# Patient Record
Sex: Male | Born: 1935 | Race: White | Hispanic: No | Marital: Married | Smoking: Former smoker
Health system: Southern US, Community
[De-identification: ages and names within clinical notes are randomized; demographics above are authoritative.]

## PROBLEM LIST (undated history)

## (undated) DIAGNOSIS — I4891 Unspecified atrial fibrillation: Secondary | ICD-10-CM

## (undated) DIAGNOSIS — K5730 Diverticulosis of large intestine without perforation or abscess without bleeding: Secondary | ICD-10-CM

## (undated) DIAGNOSIS — M1990 Unspecified osteoarthritis, unspecified site: Secondary | ICD-10-CM

## (undated) DIAGNOSIS — J302 Other seasonal allergic rhinitis: Secondary | ICD-10-CM

## (undated) DIAGNOSIS — Z923 Personal history of irradiation: Secondary | ICD-10-CM

## (undated) DIAGNOSIS — L719 Rosacea, unspecified: Secondary | ICD-10-CM

## (undated) DIAGNOSIS — J449 Chronic obstructive pulmonary disease, unspecified: Secondary | ICD-10-CM

## (undated) DIAGNOSIS — C4491 Basal cell carcinoma of skin, unspecified: Secondary | ICD-10-CM

## (undated) HISTORY — PX: EAR BIOPSY: SHX1480

## (undated) HISTORY — DX: Unspecified osteoarthritis, unspecified site: M19.90

## (undated) HISTORY — DX: Other seasonal allergic rhinitis: J30.2

## (undated) HISTORY — DX: Diverticulosis of large intestine without perforation or abscess without bleeding: K57.30

## (undated) HISTORY — DX: Unspecified atrial fibrillation: I48.91

## (undated) HISTORY — DX: Basal cell carcinoma of skin, unspecified: C44.91

## (undated) HISTORY — DX: Rosacea, unspecified: L71.9

## (undated) HISTORY — DX: Chronic obstructive pulmonary disease, unspecified: J44.9

---

## 2004-04-05 ENCOUNTER — Encounter: Admission: RE | Admit: 2004-04-05 | Discharge: 2004-04-05 | Payer: Self-pay | Admitting: Internal Medicine

## 2013-08-03 ENCOUNTER — Telehealth: Payer: Self-pay | Admitting: *Deleted

## 2013-08-03 NOTE — Telephone Encounter (Signed)
Patient is out of pradaxa and would like [facility] ref[facility] [address]. Thanks, MI

## 2013-08-04 ENCOUNTER — Telehealth: Payer: Self-pay

## 2013-08-04 ENCOUNTER — Other Ambulatory Visit: Payer: Self-pay | Admitting: Cardiology

## 2013-08-04 MED ORDER — DABIGATRAN ETEXILATE MESYLATE 150 MG PO CAPS: 150.0000 mg | ORAL_CAPSULE | Freq: Two times a day (BID) | ORAL | Status: DC

## 2013-08-11 NOTE — Telephone Encounter (Signed)
ERROR

## 2013-08-17 ENCOUNTER — Telehealth: Payer: Self-pay | Admitting: *Deleted

## 2013-08-17 NOTE — Telephone Encounter (Signed)
[employer] rx approved pradaxa through 08/12/2014 pa # [id number]

## 2013-12-09 ENCOUNTER — Ambulatory Visit (INDEPENDENT_AMBULATORY_CARE_PROVIDER_SITE_OTHER): Payer: 59 | Admitting: Cardiology

## 2013-12-09 ENCOUNTER — Encounter: Payer: Self-pay | Admitting: Cardiology

## 2013-12-09 VITALS — BP 140/77 | HR 83 | Ht 69.0 in | Wt 163.0 lb

## 2013-12-09 DIAGNOSIS — Z7901 Long term (current) use of anticoagulants: Secondary | ICD-10-CM

## 2013-12-09 DIAGNOSIS — I4891 Unspecified atrial fibrillation: Secondary | ICD-10-CM

## 2013-12-09 DIAGNOSIS — I340 Nonrheumatic mitral (valve) insufficiency: Secondary | ICD-10-CM

## 2013-12-09 DIAGNOSIS — I059 Rheumatic mitral valve disease, unspecified: Secondary | ICD-10-CM

## 2013-12-09 DIAGNOSIS — I4510 Unspecified right bundle-branch block: Secondary | ICD-10-CM

## 2013-12-09 DIAGNOSIS — J449 Chronic obstructive pulmonary disease, unspecified: Secondary | ICD-10-CM

## 2013-12-09 NOTE — Patient Instructions (Signed)
Your physician recommends that you continue on your current medications as directed. Please refer to the Current Medication list given to you today.  Your physician wants you to follow-up in: 1 year. You will receive a reminder letter in the mail two months in advance. If you don't receive a letter, please call our office to schedule the follow-up appointment.  

## 2013-12-09 NOTE — Progress Notes (Signed)
[place]. [address], [place]  [address] Phone: [phone] Fax:  [phone]  Date:  12/09/2013   ID:  Sean Richardson, DOB 09-09-1932, MRN [id number]  PCP:  No primary provider on file.   History of Present Illness: Sean Richardson is a 80 y.o. male with persistent rate controlled atrial fibrillation, echocardiogram showing normal EF mild mitral regurgitation. Allergy season has been bad for him.  He is a history of COPD, no issues. Main issue right now is taking care of his wife at home who is debilitated.  Atrial fibrillation was discovered on 06/01/09 during screening ECG which also demonstrated a right bundle branch block. Creatinine was 1.1. TSH normal. HDL 99. No strokes. He was placed on Pradaxa for anticoagulation. He has normal creatinine clearance. He has been checking his lab work through Dr. Inda Merlin. In regards to his COPD, he is stable and managed by Dr. Inda Merlin. In regards to hypertension he is doing well. He is taking care of his wife who is bed ridden do to CHF and osteoarthritis. Walking downtown. Lives at [facility]. No syncope. No bleeding. No chest pain. No palps.He has been taking care of his wife who is debilitated with arthritis CHF. No syncope. No bleeding     Wt Readings from Last 3 Encounters:  12/09/13 163 lb (73.936 kg)     No past medical history on file.  No past surgical history on file.  Current Outpatient Prescriptions  Medication Sig Dispense Refill  . aspirin (ASPIRIN CHILDRENS) 81 MG chewable tablet Richardson by mouth daily.      . dabigatran (PRADAXA) 150 MG CAPS capsule Take 1 capsule (150 mg total) by mouth 2 (two) times daily.  60 capsule  5  . fexofenadine (ALLEGRA) 180 MG tablet Take 180 mg by mouth daily.      Marland Kitchen PROAIR HFA 108 (90 BASE) MCG/ACT inhaler As needed      . sulfamethoxazole-trimethoprim (BACTRIM DS) 800-160 MG per tablet As directed      . SYMBICORT 160-4.5 MCG/ACT inhaler daily      . valsartan-hydrochlorothiazide  (DIOVAN-HCT) 320-25 MG per tablet Take 1 tab daily       No current facility-administered medications for this visit.    Allergies:   Not on File  Social History:  The patient  reports that he has quit smoking. He does not have any smokeless tobacco history on file.   Family History  Problem Relation Age of Onset  . Lung cancer Father     ROS:  Please see the history of present illness.   Denies any strokelike symptoms, syncope, chest pain, shortness of breath, bleeding  All other systems reviewed and negative.   PHYSICAL EXAM: VS:  BP 140/77  Pulse 83  Ht 5\' 9"  (1.753 m)  Wt 163 lb (73.936 kg)  BMI 24.06 kg/m2 Well nourished, well developed, in no acute distress HEENT: normal, [place]/AT, EOMI Neck: no JVD, normal carotid upstroke, no bruit Cardiac: irreg irreg; no murmur(has mild MR) Lungs:  clear to auscultation bilaterally, mild wheezing, rhonchi or rales Abd: soft, nontender, no hepatomegaly, no bruits Ext: no edema, 2+ distal pulses Skin: warm and dry GU: deferred Neuro: no focal abnormalities noted, AAO x 3  EKG:  Prior - atrial fibrillation with right bundle branch block 12/09/13-atrial fibrillation, 83, right bundle branch block, possible old inferior infarct pattern. ECHO: 2010 -normal EF, mild mitral regurgitation   ASSESSMENT AND PLAN:  1. Atrial fibrillation-currently well controlled.  Continue with rate control strategy. Not on any rate controlling agent currently. Doing well on his own. 2. Chronic anticoagulation-Pradaxa. Monitor CBC and basic metabolic profile every 6 months. Dr. Inda Merlin has been monitoring his lab work. CHADSVASc-3 (HTN, Age 24) 3. COPD-stable 4. Mitral regurgitation-mild 5. Right bundle branch block-conduction disorder noted. No high-risk symptoms such as syncope. 6. One year f/u. He will contact me if any further worrisome symptoms develop.  Signed, Candee Furbish, MD [facility]  12/09/2013 9:59 AM

## 2014-01-20 ENCOUNTER — Encounter: Payer: Self-pay | Admitting: Cardiology

## 2014-01-20 ENCOUNTER — Other Ambulatory Visit: Payer: Self-pay | Admitting: *Deleted

## 2014-01-20 MED ORDER — DABIGATRAN ETEXILATE MESYLATE 150 MG PO CAPS: 150.0000 mg | ORAL_CAPSULE | Freq: Two times a day (BID) | ORAL | Status: DC

## 2014-05-07 ENCOUNTER — Other Ambulatory Visit: Payer: Self-pay

## 2014-08-25 ENCOUNTER — Other Ambulatory Visit: Payer: Self-pay

## 2014-08-25 MED ORDER — DABIGATRAN ETEXILATE MESYLATE 150 MG PO CAPS: 150.0000 mg | ORAL_CAPSULE | Freq: Two times a day (BID) | ORAL | Status: DC

## 2014-08-25 NOTE — Telephone Encounter (Signed)
dabigatran (PRADAXA) 150 MG CAPS capsule Take 1 capsule (150 mg total) by mouth 2 (two) times daily.   1. Chronic anticoagulation-Pradaxa. Monitor CBC and basic metabolic profile every 6 months. Dr. Inda Merlin has been monitoring his lab work. CHADSVASc-3 (HTN, Age 81) Sean Furbish, MD [facility]  12/09/2013 9:59 AM

## 2014-11-26 ENCOUNTER — Other Ambulatory Visit: Payer: Self-pay | Admitting: *Deleted

## 2014-11-26 MED ORDER — DABIGATRAN ETEXILATE MESYLATE 150 MG PO CAPS: 150.0000 mg | ORAL_CAPSULE | Freq: Two times a day (BID) | ORAL | Status: DC

## 2014-12-16 ENCOUNTER — Encounter: Payer: Self-pay | Admitting: *Deleted

## 2014-12-21 ENCOUNTER — Ambulatory Visit (INDEPENDENT_AMBULATORY_CARE_PROVIDER_SITE_OTHER): Payer: Medicare Other | Admitting: Cardiology

## 2014-12-21 ENCOUNTER — Encounter: Payer: Self-pay | Admitting: Cardiology

## 2014-12-21 VITALS — BP 120/60 | HR 76 | Ht 69.0 in | Wt 148.0 lb

## 2014-12-21 DIAGNOSIS — I340 Nonrheumatic mitral (valve) insufficiency: Secondary | ICD-10-CM | POA: Diagnosis not present

## 2014-12-21 DIAGNOSIS — I481 Persistent atrial fibrillation: Secondary | ICD-10-CM | POA: Diagnosis not present

## 2014-12-21 DIAGNOSIS — Z7901 Long term (current) use of anticoagulants: Secondary | ICD-10-CM | POA: Diagnosis not present

## 2014-12-21 DIAGNOSIS — I4819 Other persistent atrial fibrillation: Secondary | ICD-10-CM

## 2014-12-21 NOTE — Progress Notes (Signed)
[place]. [address] [place], [place]  [address] Phone: [phone] Fax:  [phone]  Date:  12/21/2014   ID:  Maxen Rowland, DOB January 27, 1933, MRN [id number]  PCP:  Henrine Screws, MD   History of Present Illness: Sean Richardson is a 81 y.o. male with persistent rate controlled atrial fibrillation, echocardiogram showing normal EF mild mitral regurgitation. Allergy season has been bad for him.  He is a history of COPD, no issues. Main issue right now is taking care of his wife at home who is debilitated.  Atrial fibrillation was discovered on 06/01/09 during screening ECG which also demonstrated a right bundle branch block. Creatinine was 1.1. TSH normal. HDL 99. No strokes. He was placed on Pradaxa for anticoagulation. He has normal creatinine clearance. He has been checking his lab work through Dr. Inda Merlin. In regards to his COPD, he is stable and managed by Dr. Inda Merlin. In regards to hypertension he is doing well. He is taking care of his wife who is bed ridden do to CHF and osteoarthritis. Walking downtown. Lives at [employer]. No syncope. No bleeding. No chest pain. No palps.He has been taking care of his wife who is debilitated with arthritis CHF. No syncope. No bleeding   12/21/14-unfortunately lost his wife in April 2016. No chest pain, no shortness of breath, no bleeding. He relayed blood work to me.    Wt Readings from Last 3 Encounters:  12/21/14 148 lb (67.132 kg)  12/09/13 163 lb (73.936 kg)     Past Medical History  Diagnosis Date  . COPD (chronic obstructive pulmonary disease)   . Seasonal rhinitis   . DJD (degenerative joint disease)     of the hands  . Skin cancer, basal cell   . Rosacea   . Sigmoid diverticulosis   . A-fib     History reviewed. No pertinent past surgical history.  Current Outpatient Prescriptions  Medication Sig Dispense Refill  . aspirin (ASPIRIN CHILDRENS) 81 MG chewable tablet Chew by mouth daily.    . dabigatran (PRADAXA)  150 MG CAPS capsule Take 1 capsule (150 mg total) by mouth 2 (two) times daily. 60 capsule 0  . fexofenadine (ALLEGRA) 180 MG tablet Take 180 mg by mouth daily.    Marland Kitchen PROAIR HFA 108 (90 BASE) MCG/ACT inhaler As needed    . sulfamethoxazole-trimethoprim (BACTRIM DS) 800-160 MG per tablet As directed    . SYMBICORT 160-4.5 MCG/ACT inhaler daily    . valsartan-hydrochlorothiazide (DIOVAN-HCT) 320-25 MG per tablet Take 1 tab daily     No current facility-administered medications for this visit.    Allergies:   No Known Allergies  Social History:  The patient  reports that he has quit smoking. He does not have any smokeless tobacco history on file.   Family History  Problem Relation Age of Onset  . Bladder Cancer Father   . Pneumonia Mother     ROS:  Please see the history of present illness.   Denies any strokelike symptoms, syncope, chest pain, shortness of breath, bleeding  All other systems reviewed and negative.   PHYSICAL EXAM: VS:  BP 120/60 mmHg  Pulse 76  Ht 5\' 9"  (1.753 m)  Wt 148 lb (67.132 kg)  BMI 21.85 kg/m2 Well nourished, well developed, in no acute distress HEENT: normal, /AT, EOMI Neck: no JVD, normal carotid upstroke, no bruit Cardiac: irreg irreg; no murmur(has mild MR) Lungs:  clear to auscultation bilaterally, mild wheezing, rhonchi or rales Abd:  soft, nontender, no hepatomegaly, no bruits Ext: no edema, 2+ distal pulses Skin: warm and dry GU: deferred Neuro: no focal abnormalities noted, AAO x 3  EKG:  Today, 12/21/14-atrial fibrillation heart rate 76, right bundle branch block, no significant change from prior.  Prior - atrial fibrillation with right bundle branch block 12/09/13-atrial fibrillation, 83, right bundle branch block, possible old inferior infarct pattern.  ECHO: 2010 -normal EF, mild mitral regurgitation   Labs: (Dr. Inda Merlin) Hg 13.8. Creat 0.9  ASSESSMENT AND PLAN:  1. Atrial fibrillation-currently well controlled. Continue with rate  control strategy. Not on any rate controlling agent currently. Doing well on his own. 2. Chronic anticoagulation-Pradaxa. Monitor CBC and basic metabolic profile every 6 months. Dr. Inda Merlin has been monitoring his lab work. CHADSVASc-3 (HTN, Age 70). ASA 81. Aware of increased bleeding risk. Wants to continue ASA.  3. COPD-stable 4. Mitral regurgitation-mild 5. Right bundle branch block-conduction disorder noted. No high-risk symptoms such as syncope. 6. One year f/u. He will contact me if any further worrisome symptoms develop.  Signed, Candee Furbish, MD [facility]  12/21/2014 9:38 AM

## 2014-12-21 NOTE — Patient Instructions (Signed)
Medication Instructions:  The current medical regimen is effective;  continue present plan and medications.  Follow-Up: Follow up in 1 year with Dr. Skains.  You will receive a letter in the mail 2 months before you are due.  Please call us when you receive this letter to schedule your follow up appointment.  Thank you for choosing [facility]!!     

## 2014-12-27 ENCOUNTER — Other Ambulatory Visit: Payer: Self-pay | Admitting: Cardiology

## 2014-12-28 ENCOUNTER — Other Ambulatory Visit: Payer: Self-pay

## 2014-12-28 MED ORDER — DABIGATRAN ETEXILATE MESYLATE 150 MG PO CAPS: 150.0000 mg | ORAL_CAPSULE | Freq: Two times a day (BID) | ORAL | Status: DC

## 2015-01-17 ENCOUNTER — Other Ambulatory Visit: Payer: Self-pay

## 2015-05-30 ENCOUNTER — Encounter: Payer: Self-pay | Admitting: Cardiology

## 2015-05-31 MED ORDER — APIXABAN 5 MG PO TABS: 5.0000 mg | ORAL_TABLET | Freq: Two times a day (BID) | ORAL | Status: DC

## 2015-05-31 NOTE — Telephone Encounter (Signed)
Pt aware OK to change to Eliquis BID - [facility].

## 2015-05-31 NOTE — Telephone Encounter (Signed)
OK to change to Eliquis 5mg  PO BID  Candee Furbish, MD

## 2015-06-07 ENCOUNTER — Telehealth: Payer: Self-pay

## 2015-06-07 NOTE — Telephone Encounter (Signed)
Prior auth for Eliqis 5 mg sent to [facility]/

## 2015-06-08 ENCOUNTER — Telehealth: Payer: Self-pay

## 2015-06-08 NOTE — Telephone Encounter (Signed)
Approval obtained for Eliquis 5mg . [id number]. Good for one year.

## 2015-06-09 NOTE — Telephone Encounter (Signed)
Opened in error

## 2015-10-14 ENCOUNTER — Other Ambulatory Visit: Payer: Self-pay | Admitting: Otolaryngology

## 2015-10-14 DIAGNOSIS — H7191 Unspecified cholesteatoma, right ear: Secondary | ICD-10-CM

## 2015-10-14 DIAGNOSIS — H9011 Conductive hearing loss, unilateral, right ear, with unrestricted hearing on the contralateral side: Secondary | ICD-10-CM

## 2015-10-21 ENCOUNTER — Ambulatory Visit
Admission: RE | Admit: 2015-10-21 | Discharge: 2015-10-21 | Disposition: A | Discharge status: Home / Self Care | Payer: Medicare Other | Source: Ambulatory Visit | Attending: Otolaryngology | Admitting: Otolaryngology

## 2015-10-21 DIAGNOSIS — H7191 Unspecified cholesteatoma, right ear: Secondary | ICD-10-CM

## 2015-10-21 DIAGNOSIS — H9011 Conductive hearing loss, unilateral, right ear, with unrestricted hearing on the contralateral side: Secondary | ICD-10-CM

## 2015-11-10 ENCOUNTER — Telehealth: Payer: Self-pay | Admitting: *Deleted

## 2015-11-10 NOTE — Telephone Encounter (Signed)
  Oncology Nurse Navigator Documentation  Navigator Location: [facility] (11/10/15 1528) Navigator Encounter Type: Telephone (11/10/15 1528) Telephone: Lahoma Crocker Call;Appt Confirmation/Clarification (11/10/15 1528)     LVMM for Mr. Burts informing him of 5/3 1:00 NE and 1:30 Dr. Isidore Moos appts.  I asked that he call me with any questions.  Gayleen Orem, RN, BSN, [place] at [place] [phone]                                       Time Spent with Patient: 15 (11/10/15 1528)

## 2015-11-10 NOTE — Telephone Encounter (Signed)
  Oncology Nurse Navigator Documentation  Navigator Location: [facility] (11/10/15 1111) Navigator Encounter Type: Introductory phone call (11/10/15 1111)                   Interventions: Coordination of Care (11/10/15 1111)       Placed introductory call to new referral patient.  Introduced myself as the oncology nurse navigator that works with Dr. Isidore Moos to whom he has been referred by Dr. Lucia Gaskins.  He confirmed understanding of referral.  We discussed appt options to see Dr. Isidore Moos, he noted he would prefer to see her 5/3 as his niece will be back in town and he would like her to accompany him.  I noted I would arrange appt and get back to him.    I briefly explained my role as a navigator, indicated that I would be joining him during his appt.   I confirmed his understanding of the [facility] location, explained arrival and RadOnc registration process for appt.  I provided my contact information, encouraged him to call with questions/concerns before his appt.  He verbalized understanding of information provided, expressed appreciation for my call.  Gayleen Orem, RN, BSN, [place] at [place] [phone]                   Time Spent with Patient: 15 (11/10/15 1111)

## 2015-11-22 ENCOUNTER — Telehealth: Payer: Self-pay | Admitting: *Deleted

## 2015-11-22 NOTE — Telephone Encounter (Signed)
  Oncology Nurse Navigator Documentation  Navigator Location: [facility] (11/22/15 1404)   Telephone: Jerilee Hoh Confirmation/Clarification (11/22/15 1404)       Called Mr. Plesha to see if he had questions prior to his appt tomorrow to see Dr. Isidore Moos.  He denied.  He again confirmed his understanding of [place] location and arrival time of 12:30 for Nurse Evaluation.  I reminded him of arrival and check-in procedure for Radiation Oncology, he voiced understanding.  Gayleen Orem, RN, BSN, [place] at [place] [phone]                                     Time Spent with Patient: 15 (11/22/15 1404)

## 2015-11-22 NOTE — Progress Notes (Addendum)
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 male who presented with chronic irritation in right external auditory canal for at least 4 years, thought to be infection.  This usually responded to antibiotic.  Recently, his PCP noted a mass in that area.  Subsequently, the patient saw Dr. Lucia Gaskins of ENT surgery.  Biopsy of this area on 11-02-15 revealed: invasive squamous cell carcinoma, well differentiated.  Pertinent imaging thus far includes CT of temporal bones performed on 10-21-15 revealing a soft tissue mass in the medial aspect of the right external auditory canal, no osseous destruction.      Patient discussed at tumor board.  He is not keen on surgery.  He has blood thinners and co morbidities that heighten risk from surgery.  Would like an alternative form of treatment.   Consensus at tumor board was to recommend RT as an alternative.  Swallowing issues, if any: none  Weight Changes: none  Pain status: severe pain comes and goes in ear canal.  Sometimes radiated along right side of head. Tylenol helps.  Other symptoms:significant right hearing loss.  Tobacco history, if any: none since 1960s   Prior cancers, if any: basal cell, 3 above neck, excised.  PREVIOUS RADIATION THERAPY: No  PAST MEDICAL HISTORY:  has a past medical history of COPD (chronic obstructive pulmonary disease) ([place]); Seasonal rhinitis; DJD (degenerative joint disease); Skin cancer, basal cell; Rosacea; Sigmoid diverticulosis; and A-fib ([place]).    PAST SURGICAL HISTORY:No past surgical history on file.  FAMILY HISTORY: family  history includes Bladder Cancer in his father; Cancer in his brother; Lung cancer in his father; Pneumonia in his mother.  SOCIAL HISTORY:  reports that he has quit smoking. He does not have any smokeless tobacco history on file.  ALLERGIES: Review of patient's allergies indicates no known allergies.  MEDICATIONS:  Current Outpatient Prescriptions  Medication Sig Dispense Refill  . acetaminophen (TYLENOL) 500 MG tablet Take 1,000 mg by mouth every 6 (six) hours as needed.    Marland Kitchen apixaban (ELIQUIS) 5 MG TABS tablet Take 1 tablet (5 mg total) by mouth 2 (two) times daily. 60 tablet 6  . aspirin 81 MG tablet Take 81 mg by mouth daily.    . fexofenadine (ALLEGRA) 180 MG tablet Take 180 mg by mouth daily.    Marland Kitchen HYDROcodone-acetaminophen (NORCO/VICODIN) 5-325 MG tablet Take 1 tablet by mouth every 6 (six) hours as needed for moderate pain.    Marland Kitchen PROAIR HFA 108 (90 BASE) MCG/ACT inhaler As needed    . valsartan-hydrochlorothiazide (DIOVAN-HCT) 320-25 MG per tablet Take 1 tab daily    . SYMBICORT 160-4.5 MCG/ACT inhaler Reported on 11/23/2015     No current facility-administered medications for this encounter.    REVIEW OF SYSTEMS:  Notable for that above.   PHYSICAL EXAM:  weight is 160 lb 12.8 oz (72.938 kg). His temperature is 97.5 F (36.4 C). His blood pressure is 141/80 and his pulse is 73. His oxygen saturation is 98%.   General: Alert and oriented, in no acute distress HEENT: Head is normocephalic. Extraocular movements are intact. Oropharynx is notable for no lesions, dentures  removed. Left tympanic membrane clear.  Right external auditory canal - moist tumor is within 1cm of external ear, fills canal. Neck: Neck is notable for no facial or neck masses Heart: Regular in rate and rhythm with no murmurs, rubs, or gallops. Chest: Clear to auscultation bilaterally, with no rhonchi, wheezes, or rales. Abdomen: Soft, nontender, nondistended, with no rigidity or guarding. Extremities: No cyanosis  or edema. Lymphatics: see Neck Exam Skin: No concerning lesions. Musculoskeletal: symmetric strength and muscle tone throughout. Neurologic: Cranial nerves II through XII are grossly intact. No obvious focalities. Speech is fluent. Coordination is intact. Psychiatric: Judgment and insight are intact. Affect is appropriate.   ECOG = 1  0 - Asymptomatic (Fully active, able to carry on all predisease activities without restriction)  1 - Symptomatic but completely ambulatory (Restricted in physically strenuous activity but ambulatory and able to carry out work of a light or sedentary nature. For example, light housework, office work)  2 - Symptomatic, <50% in bed during the day (Ambulatory and capable of all self care but unable to carry out any work activities. Up and about more than 50% of waking hours)  3 - Symptomatic, >50% in bed, but not bedbound (Capable of only limited self-care, confined to bed or chair 50% or more of waking hours)  4 - Bedbound (Completely disabled. Cannot carry on any self-care. Totally confined to bed or chair)  5 - Death   Eustace Pen MM, Creech RH, Tormey DC, et al. [phone]). "Toxicity and response criteria of the [facility]". [place] Oncol. 5 (6): 649-55   LABORATORY DATA:  No results found for: WBC, HGB, HCT, MCV, PLT CMP  No results found for: NA, K, CL, CO2, GLUCOSE, BUN, CREATININE, CALCIUM, PROT, ALBUMIN, AST, ALT, ALKPHOS, BILITOT, GFRNONAA, GFRAA       RADIOGRAPHY: as above    IMPRESSION/PLAN:  This is a delightful patient with Stage I external auditory canal squamous cell carcinoma- he knows it is a serious disease.  I  recommend radiotherapy for this patient.  Although surgery +/- RT may be more effective that RT alone in some cases, he has significant potential side effects/ risks associated with resection.  He doesn't want to encounter those risks and that is understandable.  We discussed the potential risks, benefits,  and side effects of radiotherapy. We talked in detail about acute and late effects. We discussed that some of the most bothersome acute effects may be pain, dysgeusia, salivary changes, skin irritation, hair loss, fatigue. We talked about late effects which include but are not necessarily limited to hearing loss. No guarantees of treatment were given. A consent form was signed and placed in the patient's medical record. The patient is enthusiastic about proceeding with treatment. I look forward to participating in the patient's care.    Simulation (treatment planning) will take place next week.  I look forward to caring for him. __________________________________________   Eppie Gibson, MD

## 2015-11-23 ENCOUNTER — Encounter: Payer: Self-pay | Admitting: *Deleted

## 2015-11-23 ENCOUNTER — Encounter: Payer: Self-pay | Admitting: Radiation Oncology

## 2015-11-23 ENCOUNTER — Ambulatory Visit
Admission: RE | Admit: 2015-11-23 | Discharge: 2015-11-23 | Disposition: A | Discharge status: Home / Self Care | Payer: Medicare Other | Source: Ambulatory Visit | Attending: Radiation Oncology | Admitting: Radiation Oncology

## 2015-11-23 VITALS — BP 141/80 | HR 73 | Temp 97.5°F | Wt 160.8 lb

## 2015-11-23 DIAGNOSIS — Z85828 Personal history of other malignant neoplasm of skin: Secondary | ICD-10-CM | POA: Diagnosis not present

## 2015-11-23 DIAGNOSIS — I4891 Unspecified atrial fibrillation: Secondary | ICD-10-CM | POA: Insufficient documentation

## 2015-11-23 DIAGNOSIS — J310 Chronic rhinitis: Secondary | ICD-10-CM | POA: Insufficient documentation

## 2015-11-23 DIAGNOSIS — C44202 Unspecified malignant neoplasm of skin of right ear and external auricular canal: Secondary | ICD-10-CM

## 2015-11-23 DIAGNOSIS — Z510 Encounter for antineoplastic radiation therapy: Secondary | ICD-10-CM | POA: Diagnosis present

## 2015-11-23 DIAGNOSIS — J449 Chronic obstructive pulmonary disease, unspecified: Secondary | ICD-10-CM | POA: Diagnosis not present

## 2015-11-23 DIAGNOSIS — C44222 Squamous cell carcinoma of skin of right ear and external auricular canal: Secondary | ICD-10-CM

## 2015-11-23 DIAGNOSIS — L719 Rosacea, unspecified: Secondary | ICD-10-CM | POA: Insufficient documentation

## 2015-11-23 DIAGNOSIS — K5730 Diverticulosis of large intestine without perforation or abscess without bleeding: Secondary | ICD-10-CM | POA: Diagnosis not present

## 2015-11-24 NOTE — Addendum Note (Signed)
Encounter addended by: Ernst Spell, RN on: 11/24/2015  7:40 AM<BR>     Documentation filed: Charges VN

## 2015-11-27 NOTE — Progress Notes (Signed)
  Oncology Nurse Navigator Documentation  Navigator Location: [facility] (11/23/15 1340) Navigator Encounter Type: Initial RadOnc (11/23/15 1340)   Abnormal Finding Date: 10/21/15 (11/23/15 1340) Confirmed Diagnosis Date: 11/02/15 (11/23/15 1340)       Treatment Phase: Pre-Tx/Tx Discussion (11/23/15 1340) Barriers/Navigation Needs: Education (11/23/15 1340) Education: Preparing for Upcoming Surgery/ Treatment (11/23/15 1340)              Acuity: Level 2 (11/23/15 1340)   Acuity Level 2: Initial guidance, education and coordination as needed;Ongoing guidance and education throughout treatment as needed (11/23/15 1340)     Met with Mr. Baugh during initial consult with Dr.Squire.  He was accompanied by his niece and sister-in-law.   1. Further introduced myself as his Navigator, explained my role as a member of the Care Team.   2. Provided New Patient Information packet, discussed contents:  Contact information for physician(s), myself, other members of the Care Team.  Advance Directive information ([place] blue pamphlet with LCSW contact info).  He indicated he had these documents, will e-mail copy to my attention.  Fall Prevention Patient Safety Plan  Appointment Guideline  Financial Assistance Information sheet  [place] with highlight of [place] 3. Provided introductory explanation of radiation treatment including SIM planning and purpose of Aquaplast head and shoulder mask, showed them example.   4. He noted he has episodes of sharp pain in R ear drum which often radiates down front of face.  He stated he finds relief with acetaminophen. 5. He understands I will contact Dr. Pollie Friar office to arrange pre-treatment baseline audiology test.  6. I encouraged them to contact me with questions/concerns as treatments/procedures begin.   They verbalized understanding of information provided.    Gayleen Orem, RN, BSN, [place] at [place] [phone]     Time Spent with Patient: 75 (11/23/15 1340)

## 2015-11-28 ENCOUNTER — Ambulatory Visit
Admission: RE | Admit: 2015-11-28 | Discharge: 2015-11-28 | Disposition: A | Discharge status: Home / Self Care | Payer: Medicare Other | Source: Ambulatory Visit | Attending: Radiation Oncology | Admitting: Radiation Oncology

## 2015-11-28 ENCOUNTER — Encounter: Payer: Self-pay | Admitting: *Deleted

## 2015-11-28 DIAGNOSIS — C44222 Squamous cell carcinoma of skin of right ear and external auricular canal: Secondary | ICD-10-CM

## 2015-11-28 DIAGNOSIS — Z510 Encounter for antineoplastic radiation therapy: Secondary | ICD-10-CM | POA: Diagnosis not present

## 2015-11-28 MED ORDER — HYDROCODONE-ACETAMINOPHEN 5-325 MG PO TABS: 1.0000 | ORAL_TABLET | Freq: Four times a day (QID) | ORAL | Status: DC | PRN

## 2015-11-28 NOTE — Progress Notes (Signed)
Head and Neck Cancer Simulation, IMRT treatment planning note   Outpatient  Diagnosis:    ICD-9-CM ICD-10-CM   1. 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 An Aquaplast head and shoulder mask was custom fitted to the patient's anatomy. High-resolution CT axial imaging was obtained of the head and neck with contrast. I verified that the quality of the imaging is good for treatment planning. 2 Medically Necessary Treatment Devices were fabricated and supervised by me: Aquaplast mask AND custom bolus   Treatment planning note I plan to treat the patient with IMRT. I plan to treat the patient's tumor + preauricular region on the right - I plan to treat to a total dose of 66 Gray in 33  fractions. Dose calculation was ordered from dosimetry.  IMRT planning Note  IMRT is an important modality to deliver adequate dose to the patient's at risk tissues while sparing the patient's normal structures, including the:  parotid tissue, mandible, brain stem, brain,  oral cavity,  This justifies the use of IMRT in the patient's treatment.    -----------------------------------  Eppie Gibson, MD

## 2015-11-30 DIAGNOSIS — Z510 Encounter for antineoplastic radiation therapy: Secondary | ICD-10-CM | POA: Diagnosis not present

## 2015-12-01 ENCOUNTER — Telehealth: Payer: Self-pay | Admitting: *Deleted

## 2015-12-01 NOTE — Progress Notes (Signed)
  Oncology Nurse Navigator Documentation  Navigator Location: [facility] (11/28/15 1255) Navigator Encounter Type: Clinic/MDC (11/28/15 1255)             Treatment Phase: CT SIM (11/28/15 1255)       To provide support and encouragement, met with Mr. Abdallah during his CT SIM.  He was accompanied by his niece.  He tolerated procedure without incident/difficulty.  Gayleen Orem, RN, BSN, [place] at [place] [phone]                          Time Spent with Patient: 5 (11/28/15 1255)

## 2015-12-01 NOTE — Telephone Encounter (Addendum)
  Oncology Nurse Navigator Documentation  Navigator Location: [facility] (12/01/15 1008) Navigator Encounter Type: Telephone (12/01/15 1008) Telephone: Outgoing Call (12/01/15 1008)             Barriers/Navigation Needs: Coordination of Care (12/01/15 1008)     Spoke with Langley Gauss at [facility], asked that patient be contacted for baseline audiogram prior to new start RT next Wednesday 5/17.  She voiced understanding.  Gayleen Orem, RN, BSN, [place] at [place] [phone]                         Time Spent with Patient: 15 (12/01/15 1008)

## 2015-12-01 NOTE — Telephone Encounter (Signed)
A user error has taken place: encounter opened in error, closed for administrative reasons.

## 2015-12-01 NOTE — Telephone Encounter (Signed)
  Oncology Nurse Navigator Documentation  Navigator Location: [facility] (12/01/15 [id number]) Navigator Encounter Type: Telephone (12/01/15 [id number]) Telephone: Outgoing Call (12/01/15 [id number])     Spoke with Medical Records at [facility] at [place], requested fax of Mr. Chriscoe's recent labs.  Gayleen Orem, RN, BSN, [place] at [place] [phone]                                       Time Spent with Patient: 15 (12/01/15 [id number])

## 2015-12-02 DIAGNOSIS — Z510 Encounter for antineoplastic radiation therapy: Secondary | ICD-10-CM | POA: Diagnosis not present

## 2015-12-07 ENCOUNTER — Encounter: Payer: Self-pay | Admitting: *Deleted

## 2015-12-07 ENCOUNTER — Ambulatory Visit
Admission: RE | Admit: 2015-12-07 | Discharge: 2015-12-07 | Disposition: A | Discharge status: Home / Self Care | Payer: Medicare Other | Source: Ambulatory Visit | Attending: Radiation Oncology | Admitting: Radiation Oncology

## 2015-12-07 DIAGNOSIS — Z510 Encounter for antineoplastic radiation therapy: Secondary | ICD-10-CM | POA: Diagnosis not present

## 2015-12-07 NOTE — Progress Notes (Signed)
[place] Psychosocial Distress Screening Clinical Social Work  Clinical Social Work was referred by distress screening protocol.  The patient scored a 5 on the Psychosocial Distress Thermometer which indicates moderate distress. Clinical Social Worker called patient to assess for distress and other psychosocial needs.  Sean Richardson reported he is still experiencing significant pain and that is the only area causing him distress.  He shared he begins radiation treatment today and feels well supported by his two nieces.  The patient lives alone in a condo, shared he also receives from his neighbors.  ONCBCN DISTRESS SCREENING 11/23/2015  Screening Type Initial Screening  Distress experienced in past week (1-10) 5  Physical Problem type Pain  Physician notified of physical symptoms Yes    Polo Riley, MSW, LCSW, OSW-C Clinical Social Worker [place] [phone]

## 2015-12-07 NOTE — Progress Notes (Signed)
  Oncology Nurse Navigator Documentation  Navigator Location: [facility] (12/07/15 1535) Navigator Encounter Type: Clinic/MDC (12/07/15 1535)         Treatment Initiated Date: 12/07/15 (12/07/15 1535) Patient Visit Type: FUXNAT (12/07/15 1535) Treatment Phase: First Radiation Tx (12/07/15 1535)     To provide support, encouragement and care continuity, met with Sean Richardson during his first RT. He completed treatment without incident, denied questions.  He understands he can contact met with needs/concerns as treatments proceed.  Gayleen Orem, RN, BSN, [place] at [place] [phone]                            Time Spent with Patient: 30 (12/07/15 1535)

## 2015-12-08 ENCOUNTER — Ambulatory Visit
Admission: RE | Admit: 2015-12-08 | Discharge: 2015-12-08 | Disposition: A | Discharge status: Home / Self Care | Payer: Medicare Other | Source: Ambulatory Visit | Attending: Radiation Oncology | Admitting: Radiation Oncology

## 2015-12-08 DIAGNOSIS — Z510 Encounter for antineoplastic radiation therapy: Secondary | ICD-10-CM | POA: Diagnosis not present

## 2015-12-08 DIAGNOSIS — C44222 Squamous cell carcinoma of skin of right ear and external auricular canal: Secondary | ICD-10-CM | POA: Insufficient documentation

## 2015-12-08 MED ORDER — SONAFINE EX EMUL
1.0000 "application " | Freq: Once | CUTANEOUS | Status: AC
  Administered 2015-12-08: 1 via TOPICAL
  Filled 2015-12-08: qty 45

## 2015-12-08 NOTE — Progress Notes (Signed)
Pt here for patient teaching.  Pt given Radiation and You booklet, skin care instructions and Sonafine. Pt reports they have not watched the Radiation Therapy Education video, but were given the link to watch at home.  Reviewed areas of pertinence such as fatigue, hair loss, skin changes, breast swelling, cough, shortness of breath, earaches and taste changes . Pt able to give teach back of to pat skin, use unscented/gentle soap and drink plenty of water,apply Sonafine bid, avoid applying anything to skin within 4 hours of treatment and to use an electric razor if they must shave. Pt verbalizes understanding of information given and will contact nursing with any questions or concerns.     [email or web address]

## 2015-12-09 ENCOUNTER — Ambulatory Visit: Payer: Medicare Other

## 2015-12-12 ENCOUNTER — Ambulatory Visit
Admission: RE | Admit: 2015-12-12 | Discharge: 2015-12-12 | Disposition: A | Discharge status: Home / Self Care | Payer: Medicare Other | Source: Ambulatory Visit | Attending: Radiation Oncology | Admitting: Radiation Oncology

## 2015-12-12 ENCOUNTER — Encounter: Payer: Self-pay | Admitting: Radiation Oncology

## 2015-12-12 VITALS — BP 139/72 | HR 87 | Temp 97.7°F | Ht 69.0 in | Wt 159.6 lb

## 2015-12-12 DIAGNOSIS — Z510 Encounter for antineoplastic radiation therapy: Secondary | ICD-10-CM | POA: Diagnosis not present

## 2015-12-12 DIAGNOSIS — C44222 Squamous cell carcinoma of skin of right ear and external auricular canal: Secondary | ICD-10-CM

## 2015-12-12 MED ORDER — HYDROCODONE-ACETAMINOPHEN 5-325 MG PO TABS: 1.0000 | ORAL_TABLET | ORAL | Status: DC | PRN

## 2015-12-12 NOTE — Progress Notes (Signed)
   Weekly Management Note:  outpatient    ICD-9-CM ICD-10-CM   1. 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  CBCT/MVCT images/Port film x-rays were reviewed.  The chart was checked. Pain is 6/10 in right ear. Walking daily for exercise.  Physical Findings:  Wt Readings from Last 3 Encounters:  12/12/15 159 lb 9.6 oz (72.394 kg)  11/23/15 160 lb 12.8 oz (72.938 kg)  12/21/14 148 lb (67.132 kg)    height is 5\' 9"  (1.753 m) and weight is 159 lb 9.6 oz (72.394 kg). His temperature is 97.7 F (36.5 C). His blood pressure is 139/72 and his pulse is 87. His oxygen saturation is 99%.  NAD, right external auditory canal tumor appears to have changed slightly.  It is more concave but the outermost portion of the tumor is bordering on the external ear.    CBC No results found for: WBC, RBC, HGB, HCT, PLT, MCV, MCH, MCHC, RDW, LYMPHSABS, MONOABS, EOSABS, BASOSABS   CMP  No results found for: NA, K, CL, CO2, GLUCOSE, BUN, CREATININE, CALCIUM, PROT, ALBUMIN, AST, ALT, ALKPHOS, BILITOT, GFRNONAA, GFRAA   Impression:  The patient is tolerating radiotherapy.   Plan:  Continue radiotherapy as planned. I will look at his plan and possibly adjust it if necessary to allow sufficient margin on the tumor.  I am concerned it is behaving aggressively and may have grown since consult/simulation.  More vicodin Rx'd with instructions to take more often, if needed.  -----------------------------------  Eppie Gibson, MD

## 2015-12-12 NOTE — Progress Notes (Signed)
Mr. Dodson is here for his 3rd fraction of radiation to his Right Ear. He reports his pain in a 6/10 in his Right ear. He is taking 5/325 of hydrocodone every six hours during the day and two 5/325 hydrocodone at night to help him rest. He reports his pain will return about 2 hours after he takes his pain medicine. I have suggested taking the pain medicine about every 4 hours if his pain is severe. He reports he is eating well and he denies fatigue. He is using the sonafine cream twice daily. He states he is walking every morning for exercise.  BP 139/72 mmHg  Pulse 87  Temp(Src) 97.7 F (36.5 C)  Ht 5\' 9"  (1.753 m)  Wt 159 lb 9.6 oz (72.394 kg)  BMI 23.56 kg/m2  SpO2 99%   Wt Readings from Last 3 Encounters:  12/12/15 159 lb 9.6 oz (72.394 kg)  11/23/15 160 lb 12.8 oz (72.938 kg)  12/21/14 148 lb (67.132 kg)

## 2015-12-13 ENCOUNTER — Ambulatory Visit
Admission: RE | Admit: 2015-12-13 | Discharge: 2015-12-13 | Disposition: A | Discharge status: Home / Self Care | Payer: Medicare Other | Source: Ambulatory Visit | Attending: Radiation Oncology | Admitting: Radiation Oncology

## 2015-12-13 DIAGNOSIS — Z510 Encounter for antineoplastic radiation therapy: Secondary | ICD-10-CM | POA: Diagnosis not present

## 2015-12-14 ENCOUNTER — Ambulatory Visit
Admission: RE | Admit: 2015-12-14 | Discharge: 2015-12-14 | Disposition: A | Discharge status: Home / Self Care | Payer: Medicare Other | Source: Ambulatory Visit | Attending: Radiation Oncology | Admitting: Radiation Oncology

## 2015-12-14 DIAGNOSIS — Z510 Encounter for antineoplastic radiation therapy: Secondary | ICD-10-CM | POA: Diagnosis not present

## 2015-12-15 ENCOUNTER — Ambulatory Visit
Admission: RE | Admit: 2015-12-15 | Discharge: 2015-12-15 | Disposition: A | Discharge status: Home / Self Care | Payer: Medicare Other | Source: Ambulatory Visit | Attending: Radiation Oncology | Admitting: Radiation Oncology

## 2015-12-15 DIAGNOSIS — Z510 Encounter for antineoplastic radiation therapy: Secondary | ICD-10-CM | POA: Diagnosis not present

## 2015-12-16 ENCOUNTER — Ambulatory Visit
Admission: RE | Admit: 2015-12-16 | Discharge: 2015-12-16 | Disposition: A | Discharge status: Home / Self Care | Payer: Medicare Other | Source: Ambulatory Visit | Attending: Radiation Oncology | Admitting: Radiation Oncology

## 2015-12-16 DIAGNOSIS — Z510 Encounter for antineoplastic radiation therapy: Secondary | ICD-10-CM | POA: Diagnosis not present

## 2015-12-20 ENCOUNTER — Ambulatory Visit
Admission: RE | Admit: 2015-12-20 | Discharge: 2015-12-20 | Disposition: A | Discharge status: Home / Self Care | Payer: Medicare Other | Source: Ambulatory Visit | Attending: Radiation Oncology | Admitting: Radiation Oncology

## 2015-12-20 DIAGNOSIS — Z510 Encounter for antineoplastic radiation therapy: Secondary | ICD-10-CM | POA: Diagnosis not present

## 2015-12-21 ENCOUNTER — Ambulatory Visit
Admission: RE | Admit: 2015-12-21 | Discharge: 2015-12-21 | Disposition: A | Discharge status: Home / Self Care | Payer: Medicare Other | Source: Ambulatory Visit | Attending: Radiation Oncology | Admitting: Radiation Oncology

## 2015-12-21 ENCOUNTER — Encounter: Payer: Self-pay | Admitting: Radiation Oncology

## 2015-12-21 VITALS — BP 136/80 | HR 93 | Temp 97.9°F | Ht 69.0 in | Wt 159.4 lb

## 2015-12-21 DIAGNOSIS — Z510 Encounter for antineoplastic radiation therapy: Secondary | ICD-10-CM | POA: Diagnosis not present

## 2015-12-21 DIAGNOSIS — C44222 Squamous cell carcinoma of skin of right ear and external auricular canal: Secondary | ICD-10-CM

## 2015-12-21 MED ORDER — HYDROCODONE-ACETAMINOPHEN 5-325 MG PO TABS: 1.0000 | ORAL_TABLET | ORAL | Status: DC | PRN

## 2015-12-21 NOTE — Progress Notes (Signed)
Mr. Smedley is here for his 9th fraction of radiation to his Right Ear. He does complain of severe pain to his Right Ear which has been occuring for months. The pain has increased since starting radiation. He is taking one Hydrocodone every four hours during the day, and 2 at bedtime as was suggested by Dr. Isidore Moos. He states his pain usually returns after only 2 hours after he has taking the Hydrocodone. He would like to ask about taking 2 Hydrocodone every 4 hours. He has some redness to his Right ear. He does report some bleeding and drainage from his ear, and will use a cotton ball to contain the drainage. He is using the sonafine cream twice daily. He is eating well per his report.   BP 136/80 mmHg  Pulse 93  Temp(Src) 97.9 F (36.6 C)  Ht 5\' 9"  (1.753 m)  Wt 159 lb 6.4 oz (72.303 kg)  BMI 23.53 kg/m2  SpO2 96%   Wt Readings from Last 3 Encounters:  12/21/15 159 lb 6.4 oz (72.303 kg)  12/12/15 159 lb 9.6 oz (72.394 kg)  11/23/15 160 lb 12.8 oz (72.938 kg)

## 2015-12-21 NOTE — Progress Notes (Signed)
  Radiation Oncology         [phone] ________________________________  Name: Sean Richardson MRN: [id number]  Date: 12/21/2015  DOB: [birth year]  Weekly Radiation Therapy Management    ICD-9-CM ICD-10-CM   1. 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 . . . . . . . The patient presents for routine under treatment assessment.                                   Sean Richardson is here for his 9th fraction of radiation to his Right Ear. He does complain of severe pain to his Right Ear which has been occuring for months. The pain has increased since starting radiation. He is taking one Hydrocodone every four hours during the day and 2 at bedtime as was suggested by Dr. Isidore Moos. He states his pain usually returns after only 2 hours after he takes the Hydrocodone. He would like to ask about taking 2 Hydrocodone every 4 hours. He sleeps, but not well. He has some redness to his right ear. He does report some bleeding and drainage from his ear and will use a cotton ball to contain the drainage. He is using the sonafine cream twice daily. He is eating well per his report.                                  Set-up films were reviewed.                                 The chart was checked. Physical Findings. . .  height is 5\' 9"  (1.753 m) and weight is 159 lb 6.4 oz (72.303 kg). His temperature is 97.9 F (36.6 C). His blood pressure is 136/80 and his pulse is 93. His oxygen saturation is 96%. . Right external auditory canal tumor noted. No bleeding or drainage at this time. Impression . . . . . . . The patient is tolerating radiation. Plan . . . . . . . . . . . . Continue treatment as planned. It is appropriate for the patient for the patient to increase his Norco 5-325 to 2 pill every 4 hours for his pain. I have refilled his prescription. Once he  has tumor shrinkage hopefully this pain level will subside. ________________________________   Blair Promise, PhD, MD  This document serves as a record of services personally performed by Gery Pray, MD. It was created on his behalf by Darcus Austin, a trained medical scribe. The creation of this record is based on the scribe's personal observations and the provider's statements to them. This document has been checked and approved by the attending provider.

## 2015-12-22 ENCOUNTER — Ambulatory Visit
Admission: RE | Admit: 2015-12-22 | Discharge: 2015-12-22 | Disposition: A | Discharge status: Home / Self Care | Payer: Medicare Other | Source: Ambulatory Visit | Attending: Radiation Oncology | Admitting: Radiation Oncology

## 2015-12-22 DIAGNOSIS — Z510 Encounter for antineoplastic radiation therapy: Secondary | ICD-10-CM | POA: Diagnosis not present

## 2015-12-23 ENCOUNTER — Ambulatory Visit
Admission: RE | Admit: 2015-12-23 | Discharge: 2015-12-23 | Disposition: A | Discharge status: Home / Self Care | Payer: Medicare Other | Source: Ambulatory Visit | Attending: Radiation Oncology | Admitting: Radiation Oncology

## 2015-12-23 DIAGNOSIS — Z510 Encounter for antineoplastic radiation therapy: Secondary | ICD-10-CM | POA: Diagnosis not present

## 2015-12-26 ENCOUNTER — Ambulatory Visit
Admission: RE | Admit: 2015-12-26 | Discharge: 2015-12-26 | Disposition: A | Discharge status: Home / Self Care | Payer: Medicare Other | Source: Ambulatory Visit | Attending: Radiation Oncology | Admitting: Radiation Oncology

## 2015-12-26 ENCOUNTER — Encounter: Payer: Self-pay | Admitting: Radiation Oncology

## 2015-12-26 VITALS — BP 133/71 | HR 88 | Temp 98.1°F | Ht 69.0 in | Wt 159.4 lb

## 2015-12-26 DIAGNOSIS — Z510 Encounter for antineoplastic radiation therapy: Secondary | ICD-10-CM | POA: Diagnosis not present

## 2015-12-26 DIAGNOSIS — C44222 Squamous cell carcinoma of skin of right ear and external auricular canal: Secondary | ICD-10-CM

## 2015-12-26 MED ORDER — HYDROCODONE-ACETAMINOPHEN 5-325 MG PO TABS: 2.0000 | ORAL_TABLET | ORAL | Status: DC | PRN

## 2015-12-26 NOTE — Progress Notes (Addendum)
Sean Richardson is here for his 12th fraction of radiation to his Right Ear. He rates his pain a 6/10 this morning, but he just took his pain medicine at 8:00. He is taking  Two 5/325 Hydrocodone's about every 4 hours at the advice of Dr. Sondra Come and he will need a new prescription for that today. The skin to his Right ear and Right side of his face is red, and he is using the sonafine twice daily. He denies fatigue at this time, and is eating well.  BP 133/71 mmHg  Pulse 88  Temp(Src) 98.1 F (36.7 C)  Ht 5\' 9"  (1.753 m)  Wt 159 lb 6.4 oz (72.303 kg)  BMI 23.53 kg/m2  SpO2 99%   Wt Readings from Last 3 Encounters:  12/26/15 159 lb 6.4 oz (72.303 kg)  12/21/15 159 lb 6.4 oz (72.303 kg)  12/12/15 159 lb 9.6 oz (72.394 kg)

## 2015-12-26 NOTE — Progress Notes (Signed)
   Weekly Management Note:  outpatient    ICD-9-CM ICD-10-CM   1. 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  CBCT/MVCT images/Port film x-rays were reviewed.  The chart was checked.  Sean Richardson is here for his 12th fraction of radiation to his Right Ear. He rates his pain a 6/10 this morning, but he just took his pain medicine at 8:00. He is taking  Two 5/325 Hydrocodone's about every 4 hours at the advice of Dr. Sondra Come and he will need a new prescription for that today. He is using the sonafine twice daily. He denies fatigue at this time, and is eating well. He is satisfied with his pain control and denies constipation. Has senokot if needed.   Physical Findings:  Wt Readings from Last 3 Encounters:  12/26/15 159 lb 6.4 oz (72.303 kg)  12/21/15 159 lb 6.4 oz (72.303 kg)  12/12/15 159 lb 9.6 oz (72.394 kg)    height is 5\' 9"  (1.753 m) and weight is 159 lb 6.4 oz (72.303 kg). His temperature is 98.1 F (36.7 C). His blood pressure is 133/71 and his pulse is 88. His oxygen saturation is 99%.  NAD, right external auditory canal tumor appears to have regressed slightly.   CBC No results found for: WBC, RBC, HGB, HCT, PLT, MCV, MCH, MCHC, RDW, LYMPHSABS, MONOABS, EOSABS, BASOSABS   CMP  No results found for: NA, K, CL, CO2, GLUCOSE, BUN, CREATININE, CALCIUM, PROT, ALBUMIN, AST, ALT, ALKPHOS, BILITOT, GFRNONAA, GFRAA   Impression:  The patient is tolerating radiotherapy.   Plan:  Continue radiotherapy as planned.   Of note, his plan was adjusted slightly two weeks ago to afford more generous coverage on the tumor.    -----------------------------------  Eppie Gibson, MD

## 2015-12-27 ENCOUNTER — Ambulatory Visit
Admission: RE | Admit: 2015-12-27 | Discharge: 2015-12-27 | Disposition: A | Discharge status: Home / Self Care | Payer: Medicare Other | Source: Ambulatory Visit | Attending: Radiation Oncology | Admitting: Radiation Oncology

## 2015-12-27 DIAGNOSIS — Z510 Encounter for antineoplastic radiation therapy: Secondary | ICD-10-CM | POA: Diagnosis not present

## 2015-12-28 ENCOUNTER — Ambulatory Visit
Admission: RE | Admit: 2015-12-28 | Discharge: 2015-12-28 | Disposition: A | Discharge status: Home / Self Care | Payer: Medicare Other | Source: Ambulatory Visit | Attending: Radiation Oncology | Admitting: Radiation Oncology

## 2015-12-28 DIAGNOSIS — Z510 Encounter for antineoplastic radiation therapy: Secondary | ICD-10-CM | POA: Diagnosis not present

## 2015-12-29 ENCOUNTER — Other Ambulatory Visit: Payer: Self-pay | Admitting: Radiation Oncology

## 2015-12-29 ENCOUNTER — Ambulatory Visit
Admission: RE | Admit: 2015-12-29 | Discharge: 2015-12-29 | Disposition: A | Discharge status: Home / Self Care | Payer: Medicare Other | Source: Ambulatory Visit | Attending: Radiation Oncology | Admitting: Radiation Oncology

## 2015-12-29 DIAGNOSIS — C44222 Squamous cell carcinoma of skin of right ear and external auricular canal: Secondary | ICD-10-CM

## 2015-12-29 DIAGNOSIS — Z510 Encounter for antineoplastic radiation therapy: Secondary | ICD-10-CM | POA: Diagnosis not present

## 2015-12-29 MED ORDER — OXYCODONE-ACETAMINOPHEN 10-325 MG PO TABS: 1.0000 | ORAL_TABLET | ORAL | Status: DC | PRN

## 2015-12-29 MED FILL — OXYCODONE-APAP 10-325 TAB: 10-325 | 9 days supply | Qty: 42 | Fill #0

## 2015-12-30 ENCOUNTER — Ambulatory Visit
Admission: RE | Admit: 2015-12-30 | Discharge: 2015-12-30 | Disposition: A | Discharge status: Home / Self Care | Payer: Medicare Other | Source: Ambulatory Visit | Attending: Radiation Oncology | Admitting: Radiation Oncology

## 2015-12-30 DIAGNOSIS — Z510 Encounter for antineoplastic radiation therapy: Secondary | ICD-10-CM | POA: Diagnosis not present

## 2016-01-02 ENCOUNTER — Ambulatory Visit
Admission: RE | Admit: 2016-01-02 | Discharge: 2016-01-02 | Disposition: A | Discharge status: Home / Self Care | Payer: Medicare Other | Source: Ambulatory Visit | Attending: Radiation Oncology | Admitting: Radiation Oncology

## 2016-01-02 ENCOUNTER — Encounter: Payer: Self-pay | Admitting: Radiation Oncology

## 2016-01-02 VITALS — BP 134/78 | HR 63 | Temp 97.7°F | Ht 69.0 in | Wt 157.8 lb

## 2016-01-02 DIAGNOSIS — Z510 Encounter for antineoplastic radiation therapy: Secondary | ICD-10-CM | POA: Diagnosis not present

## 2016-01-02 DIAGNOSIS — C44222 Squamous cell carcinoma of skin of right ear and external auricular canal: Secondary | ICD-10-CM

## 2016-01-02 MED ORDER — OXYCODONE-ACETAMINOPHEN 10-325 MG PO TABS: 1.0000 | ORAL_TABLET | ORAL | Status: DC | PRN

## 2016-01-02 NOTE — Progress Notes (Signed)
Mr. Sean Richardson is here for his 17th fraction of radiation to his Right Ear. He reports improvement in his Right Ear pain which he rates a 3/10 today. He is taking 10/325 of Oxycodone about every 4 hours during the daytime and at night. He denies fatigue.  He reports a good appetite. His Right Ear is red, with some dry skin visible. He is using the sonafine cream twice daily as directed. He reports an occasional scant ammount of bloody drainage from his right ear, but thinks this may be from a cotton ball breaking a scab that has formed.   BP 134/78 mmHg  Pulse 63  Temp(Src) 97.7 F (36.5 C)  Ht 5\' 9"  (1.753 m)  Wt 157 lb 12.8 oz (71.578 kg)  BMI 23.29 kg/m2   Wt Readings from Last 3 Encounters:  01/02/16 157 lb 12.8 oz (71.578 kg)  12/26/15 159 lb 6.4 oz (72.303 kg)  12/21/15 159 lb 6.4 oz (72.303 kg)

## 2016-01-02 NOTE — Progress Notes (Addendum)
   Weekly Management Note:  outpatient    ICD-9-CM ICD-10-CM   1. 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  CBCT/MVCT images/Port film x-rays were reviewed.  The chart was checked. Pain is well controlled.  Medication was changed to percocet 10-325 to limit tylenol use.  Taking one q 4hrs prn.  Pleased with how he is doing. No fatigue, good appetite and taste    Physical Findings:  Wt Readings from Last 3 Encounters:  01/02/16 157 lb 12.8 oz (71.578 kg)  12/26/15 159 lb 6.4 oz (72.303 kg)  12/21/15 159 lb 6.4 oz (72.303 kg)    height is 5\' 9"  (1.753 m) and weight is 157 lb 12.8 oz (71.578 kg). His temperature is 97.7 F (36.5 C). His blood pressure is 134/78 and his pulse is 63.  NAD, right external auditory canal tumor appears to have regressed further; skin erythematous over external ear and right cheek   CBC No results found for: WBC, RBC, HGB, HCT, PLT, MCV, MCH, MCHC, RDW, LYMPHSABS, MONOABS, EOSABS, BASOSABS   CMP  No results found for: NA, K, CL, CO2, GLUCOSE, BUN, CREATININE, CALCIUM, PROT, ALBUMIN, AST, ALT, ALKPHOS, BILITOT, GFRNONAA, GFRAA   Impression:  The patient is tolerating radiotherapy.   Plan:  Continue radiotherapy as planned.  Medication refill for percocet.  -----------------------------------  Eppie Gibson, MD

## 2016-01-03 ENCOUNTER — Ambulatory Visit
Admission: RE | Admit: 2016-01-03 | Discharge: 2016-01-03 | Disposition: A | Discharge status: Home / Self Care | Payer: Medicare Other | Source: Ambulatory Visit | Attending: Radiation Oncology | Admitting: Radiation Oncology

## 2016-01-03 DIAGNOSIS — Z510 Encounter for antineoplastic radiation therapy: Secondary | ICD-10-CM | POA: Diagnosis not present

## 2016-01-04 ENCOUNTER — Ambulatory Visit: Payer: Medicare Other

## 2016-01-04 ENCOUNTER — Ambulatory Visit
Admission: RE | Admit: 2016-01-04 | Discharge: 2016-01-04 | Disposition: A | Discharge status: Home / Self Care | Payer: Medicare Other | Source: Ambulatory Visit | Attending: Radiation Oncology | Admitting: Radiation Oncology

## 2016-01-04 DIAGNOSIS — Z510 Encounter for antineoplastic radiation therapy: Secondary | ICD-10-CM | POA: Diagnosis not present

## 2016-01-05 ENCOUNTER — Encounter: Payer: Self-pay | Admitting: *Deleted

## 2016-01-05 ENCOUNTER — Ambulatory Visit
Admission: RE | Admit: 2016-01-05 | Discharge: 2016-01-05 | Disposition: A | Discharge status: Home / Self Care | Payer: Medicare Other | Source: Ambulatory Visit | Attending: Radiation Oncology | Admitting: Radiation Oncology

## 2016-01-05 ENCOUNTER — Other Ambulatory Visit: Payer: Self-pay | Admitting: Cardiology

## 2016-01-05 DIAGNOSIS — Z510 Encounter for antineoplastic radiation therapy: Secondary | ICD-10-CM | POA: Diagnosis not present

## 2016-01-05 MED FILL — OXYCODONE-APAP 10-325 TAB: 10-325 | 3 days supply | Qty: 21 | Fill #0

## 2016-01-05 NOTE — Progress Notes (Signed)
  Oncology Nurse Navigator Documentation                          To provide support, encouragement and care continuity, met with Mr. Monnier during WUT with Dr. Isidore Moos. He reported:  Applying Sonafine BID.  Continuing R ear soarness but with some improvement.  Taking Percocet 1T q4 hrs for relief.  Also applying warm compress to ear at bedtime. He understands I can be contacted with needs/concerns.  Gayleen Orem, RN, BSN, [place] at [place] [phone]

## 2016-01-06 ENCOUNTER — Ambulatory Visit
Admission: RE | Admit: 2016-01-06 | Discharge: 2016-01-06 | Disposition: A | Discharge status: Home / Self Care | Payer: Medicare Other | Source: Ambulatory Visit | Attending: Radiation Oncology | Admitting: Radiation Oncology

## 2016-01-06 DIAGNOSIS — Z510 Encounter for antineoplastic radiation therapy: Secondary | ICD-10-CM | POA: Diagnosis not present

## 2016-01-09 ENCOUNTER — Ambulatory Visit
Admission: RE | Admit: 2016-01-09 | Discharge: 2016-01-09 | Disposition: A | Discharge status: Home / Self Care | Payer: Medicare Other | Source: Ambulatory Visit | Attending: Radiation Oncology | Admitting: Radiation Oncology

## 2016-01-09 ENCOUNTER — Encounter: Payer: Self-pay | Admitting: Radiation Oncology

## 2016-01-09 VITALS — BP 128/81 | HR 100 | Temp 97.8°F | Ht 69.0 in | Wt 159.8 lb

## 2016-01-09 DIAGNOSIS — Z510 Encounter for antineoplastic radiation therapy: Secondary | ICD-10-CM | POA: Diagnosis not present

## 2016-01-09 DIAGNOSIS — C44222 Squamous cell carcinoma of skin of right ear and external auricular canal: Secondary | ICD-10-CM

## 2016-01-09 MED ORDER — OXYCODONE-ACETAMINOPHEN 10-325 MG PO TABS: 1.0000 | ORAL_TABLET | ORAL | Status: DC | PRN

## 2016-01-09 MED FILL — OXYCODONE-APAP 10-325 TAB: 10-325 | 7 days supply | Qty: 42 | Fill #0

## 2016-01-09 NOTE — Progress Notes (Signed)
Mr. Sciullo is here for his 21st fraction of radiation to his Right Ear. He reports pain a 3/10 today, but reports good relief with 10/325 Oxycodone about every 4 hours. He does report severe constipation over the weekend. He needed to use a fleets enema for relief. He tried taking sennakot, but it caused some nausea. He is now taking a stool softener and a probiotic for relief. He denies fatigue, and is still able to complete his normal activities. The skin to the Right side of his face is red, with some dry areas, and he is using the sonafine as directed. He reports he is eating and drinking well.   BP 128/81 mmHg  Pulse 100  Temp(Src) 97.8 F (36.6 C)  Ht 5\' 9"  (1.753 m)  Wt 159 lb 12.8 oz (72.485 kg)  BMI 23.59 kg/m2  SpO2 97%   Wt Readings from Last 3 Encounters:  01/09/16 159 lb 12.8 oz (72.485 kg)  01/02/16 157 lb 12.8 oz (71.578 kg)  12/26/15 159 lb 6.4 oz (72.303 kg)

## 2016-01-09 NOTE — Progress Notes (Addendum)
   Weekly Management Note:  outpatient    ICD-9-CM ICD-10-CM   1. 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  CBCT/MVCT images/Port film x-rays were reviewed.  The chart was checked. Pain is well controlled.  Percocet 10-325 ; Taking one q 4hrs prn.  Pleased with how he is doing.    Constipation issues.  Enema helped this weekend.    Physical Findings:  Wt Readings from Last 3 Encounters:  01/09/16 159 lb 12.8 oz (72.485 kg)  01/02/16 157 lb 12.8 oz (71.578 kg)  12/26/15 159 lb 6.4 oz (72.303 kg)    height is 5\' 9"  (1.753 m) and weight is 159 lb 12.8 oz (72.485 kg). His temperature is 97.8 F (36.6 C). His blood pressure is 128/81 and his pulse is 100. His oxygen saturation is 97%.  NAD, right external auditory canal tumor appears to have slightly regressed further; skin erythematous over external ear and right cheek   CBC No results found for: WBC, RBC, HGB, HCT, PLT, MCV, MCH, MCHC, RDW, LYMPHSABS, MONOABS, EOSABS, BASOSABS   CMP  No results found for: NA, K, CL, CO2, GLUCOSE, BUN, CREATININE, CALCIUM, PROT, ALBUMIN, AST, ALT, ALKPHOS, BILITOT, GFRNONAA, GFRAA   Impression:  The patient is tolerating radiotherapy.   Plan:  Continue radiotherapy as planned.  Medication refill for percocet. Continue Colace. Start Miralax daily for constipation with suppositories and enemas if needed.  Prunes and prune juice as well.  -----------------------------------  Eppie Gibson, MD

## 2016-01-09 NOTE — Addendum Note (Signed)
Encounter addended by: Eppie Gibson, MD on: 01/09/2016  9:43 AM<BR>     Documentation filed: Notes Section

## 2016-01-10 ENCOUNTER — Ambulatory Visit
Admission: RE | Admit: 2016-01-10 | Discharge: 2016-01-10 | Disposition: A | Discharge status: Home / Self Care | Payer: Medicare Other | Source: Ambulatory Visit | Attending: Radiation Oncology | Admitting: Radiation Oncology

## 2016-01-10 DIAGNOSIS — Z510 Encounter for antineoplastic radiation therapy: Secondary | ICD-10-CM | POA: Diagnosis not present

## 2016-01-11 ENCOUNTER — Ambulatory Visit
Admission: RE | Admit: 2016-01-11 | Discharge: 2016-01-11 | Disposition: A | Discharge status: Home / Self Care | Payer: Medicare Other | Source: Ambulatory Visit | Attending: Radiation Oncology | Admitting: Radiation Oncology

## 2016-01-11 DIAGNOSIS — Z510 Encounter for antineoplastic radiation therapy: Secondary | ICD-10-CM | POA: Diagnosis not present

## 2016-01-12 ENCOUNTER — Ambulatory Visit
Admission: RE | Admit: 2016-01-12 | Discharge: 2016-01-12 | Disposition: A | Discharge status: Home / Self Care | Payer: Medicare Other | Source: Ambulatory Visit | Attending: Radiation Oncology | Admitting: Radiation Oncology

## 2016-01-12 DIAGNOSIS — Z510 Encounter for antineoplastic radiation therapy: Secondary | ICD-10-CM | POA: Diagnosis not present

## 2016-01-13 ENCOUNTER — Ambulatory Visit
Admission: RE | Admit: 2016-01-13 | Discharge: 2016-01-13 | Disposition: A | Discharge status: Home / Self Care | Payer: Medicare Other | Source: Ambulatory Visit | Attending: Radiation Oncology | Admitting: Radiation Oncology

## 2016-01-13 DIAGNOSIS — Z510 Encounter for antineoplastic radiation therapy: Secondary | ICD-10-CM | POA: Diagnosis not present

## 2016-01-13 NOTE — Progress Notes (Signed)
   Weekly Management Note:  outpatient    ICD-9-CM ICD-10-CM   1. 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  CBCT/MVCT images/Port film x-rays were reviewed.  The chart was checked.  Constipated now alleviated.  Eating well. Drinking well.  Pain is still present but helped significantly by 10-325 oxycodone/acet q 4hrs    Physical Findings:  Wt Readings from Last 3 Encounters:  01/16/16 158 lb 12.8 oz (72.031 kg)  01/09/16 159 lb 12.8 oz (72.485 kg)  01/02/16 157 lb 12.8 oz (71.578 kg)    height is 5\' 9"  (1.753 m) and weight is 158 lb 12.8 oz (72.031 kg). His oral temperature is 97.7 F (36.5 C). His blood pressure is 128/74 and his pulse is 74. His respiration is 18 and oxygen saturation is 100%.    Erythema over right ear/ check.  Similar moist whitish tissue in ear canal visible.  CBC No results found for: WBC, RBC, HGB, HCT, PLT, MCV, MCH, MCHC, RDW, LYMPHSABS, MONOABS, EOSABS, BASOSABS   CMP  No results found for: NA, K, CL, CO2, GLUCOSE, BUN, CREATININE, CALCIUM, PROT, ALBUMIN, AST, ALT, ALKPHOS, BILITOT, GFRNONAA, GFRAA   Impression:  The patient is tolerating radiotherapy.   Plan:  Continue radiotherapy as planned.   He will see Dr Sondra Come next week.  Will need medication refills again for pain at that time. 1 mo f/u ordered.  -----------------------------------  Eppie Gibson, MD

## 2016-01-16 ENCOUNTER — Ambulatory Visit
Admission: RE | Admit: 2016-01-16 | Discharge: 2016-01-16 | Disposition: A | Discharge status: Home / Self Care | Payer: Medicare Other | Source: Ambulatory Visit | Attending: Radiation Oncology | Admitting: Radiation Oncology

## 2016-01-16 ENCOUNTER — Encounter: Payer: Self-pay | Admitting: Radiation Oncology

## 2016-01-16 VITALS — BP 128/74 | HR 74 | Temp 97.7°F | Resp 18 | Ht 69.0 in | Wt 158.8 lb

## 2016-01-16 DIAGNOSIS — Z510 Encounter for antineoplastic radiation therapy: Secondary | ICD-10-CM | POA: Diagnosis not present

## 2016-01-16 DIAGNOSIS — C44222 Squamous cell carcinoma of skin of right ear and external auricular canal: Secondary | ICD-10-CM

## 2016-01-16 MED ORDER — OXYCODONE-ACETAMINOPHEN 10-325 MG PO TABS: 1.0000 | ORAL_TABLET | ORAL | Status: DC | PRN

## 2016-01-16 MED FILL — OXYCODONE-APAP 10-325 TAB: 10-325 | 9 days supply | Qty: 54 | Fill #0

## 2016-01-16 NOTE — Progress Notes (Signed)
Mr. Burker is here for his 27st fraction of radiation to his Right Ear. He reports pain a 3/10 today, but reports good relief with 10/325 Oxycodone about every 4 hours.  Reports constipation is under control with Senokot . He is now taking a stool softener and a probiotic for relief.  He is eating and drinking well.  Denies fatigue, and is still able to complete his normal activities. The skin to the Right side of his face is red, with some dry areas, and he is using the sonafine as directed.     BP 128/74 mmHg  Pulse 74  Temp(Src) 97.7 F (36.5 C) (Oral)  Resp 18  Ht 5\' 9"  (1.753 m)  Wt 158 lb 12.8 oz (72.031 kg)  BMI 23.44 kg/m2  SpO2 100%  Wt Readings from Last 3 Encounters:  01/16/16 158 lb 12.8 oz (72.031 kg)  01/09/16 159 lb 12.8 oz (72.485 kg)  01/02/16 157 lb 12.8 oz (71.578 kg)

## 2016-01-17 ENCOUNTER — Ambulatory Visit
Admission: RE | Admit: 2016-01-17 | Discharge: 2016-01-17 | Disposition: A | Discharge status: Home / Self Care | Payer: Medicare Other | Source: Ambulatory Visit | Attending: Radiation Oncology | Admitting: Radiation Oncology

## 2016-01-17 DIAGNOSIS — Z510 Encounter for antineoplastic radiation therapy: Secondary | ICD-10-CM | POA: Diagnosis not present

## 2016-01-18 ENCOUNTER — Ambulatory Visit
Admission: RE | Admit: 2016-01-18 | Discharge: 2016-01-18 | Disposition: A | Discharge status: Home / Self Care | Payer: Medicare Other | Source: Ambulatory Visit | Attending: Radiation Oncology | Admitting: Radiation Oncology

## 2016-01-18 ENCOUNTER — Encounter: Payer: Self-pay | Admitting: *Deleted

## 2016-01-18 DIAGNOSIS — Z510 Encounter for antineoplastic radiation therapy: Secondary | ICD-10-CM | POA: Diagnosis not present

## 2016-01-18 NOTE — Progress Notes (Signed)
   Weekly Management Note:  outpatient    ICD-9-CM ICD-10-CM   1. 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  CBCT/MVCT images/Port film x-rays were reviewed.  The chart was checked.    Sean Richardson is here for his 31st fraction of radiation to his Right Ear. He rates his pain a 3/10 in his Right Ear and Right Ear drum and is improving. He is taking his 10/325mg  oxycodone every 4 hours, but has decreased his dose at bedtime. Also uses a heating on his right ear to help with pain. The skin to his ear and Right side of his face is red. He is using the sonafine cream twice daily. He does have some drainage present in his Right Ear. He does have some nausea during the day. He reports taste changes which has decreased his oral intake. He is having daily bowelmovements with usage of miralax daily. He has a follow up appointment for 02/22/16 with Dr. Isidore Moos.    Physical Findings:  Wt Readings from Last 3 Encounters:  01/20/16 159 lb 11.2 oz (72.439 kg)  01/16/16 158 lb 12.8 oz (72.031 kg)  01/09/16 159 lb 12.8 oz (72.485 kg)    weight is 159 lb 11.2 oz (72.439 kg). His temperature is 97.7 F (36.5 C). His blood pressure is 148/76 and his pulse is 89. His oxygen saturation is 100%.    The abnormal moist tissue filling his right ear canal appears to have regressed slightly and now there is a small  patency that I can appreciate in the ear canal.   CBC No results found for: WBC, RBC, HGB, HCT, PLT, MCV, MCH, MCHC, RDW, LYMPHSABS, MONOABS, EOSABS, BASOSABS   CMP  No results found for: NA, K, CL, CO2, GLUCOSE, BUN, CREATININE, CALCIUM, PROT, ALBUMIN, AST, ALT, ALKPHOS, BILITOT, GFRNONAA, GFRAA   Impression:  The patient is tolerating radiotherapy.   Plan:  Continue radiotherapy as  planned, finishes treatment next week. One month follow up scheduled. Refilled his Oxycodone x 160 tabs. As I am giving him a larger prescription, I advised him to keep his medication in a safe place to avoid theft.   -----------------------------------  Eppie Gibson, MD  This document serves as a record of services personally performed by Eppie Gibson, MD. It was created on her behalf by Derek Mound, a trained medical scribe. The creation of this record is based on the scribe's personal observations and the provider's statements to them. This document has been checked and approved by the attending provider.

## 2016-01-19 ENCOUNTER — Ambulatory Visit
Admission: RE | Admit: 2016-01-19 | Discharge: 2016-01-19 | Disposition: A | Discharge status: Home / Self Care | Payer: Medicare Other | Source: Ambulatory Visit | Attending: Radiation Oncology | Admitting: Radiation Oncology

## 2016-01-19 DIAGNOSIS — Z510 Encounter for antineoplastic radiation therapy: Secondary | ICD-10-CM | POA: Diagnosis not present

## 2016-01-20 ENCOUNTER — Encounter: Payer: Self-pay | Admitting: Radiation Oncology

## 2016-01-20 ENCOUNTER — Ambulatory Visit
Admission: RE | Admit: 2016-01-20 | Discharge: 2016-01-20 | Disposition: A | Discharge status: Home / Self Care | Payer: Medicare Other | Source: Ambulatory Visit | Attending: Radiation Oncology | Admitting: Radiation Oncology

## 2016-01-20 VITALS — BP 148/76 | HR 89 | Temp 97.7°F | Wt 159.7 lb

## 2016-01-20 DIAGNOSIS — C44222 Squamous cell carcinoma of skin of right ear and external auricular canal: Secondary | ICD-10-CM

## 2016-01-20 DIAGNOSIS — Z510 Encounter for antineoplastic radiation therapy: Secondary | ICD-10-CM | POA: Diagnosis not present

## 2016-01-20 MED ORDER — OXYCODONE-ACETAMINOPHEN 10-325 MG PO TABS
1.0000 | ORAL_TABLET | ORAL | Status: DC | PRN
Start: 2016-01-20 — End: 2016-02-22

## 2016-01-20 NOTE — Progress Notes (Signed)
Sean Richardson is here for his 31st fraction of radiation to his Right Ear. He rates his pain a 3/10 in his Right Ear and Right Ear drum. He is taking his 10/325mg  oxycodone every 4 hours, but has decreased his dose at bedtime. The skin to his ear and Right side of his face is red. He is using the sonafine cream twice daily. He does have some drainage present in his Right Ear. He does have some nausea during the day. He reports taste changes which has decreased his oral intake. He is having daily bowelmovements with usage of miralax daily. He has a follow up appointment for 02/22/16 with Dr. Isidore Moos.   BP 148/76 mmHg  Pulse 89  Temp(Src) 97.7 F (36.5 C)  Wt 159 lb 11.2 oz (72.439 kg)  SpO2 100%   Wt Readings from Last 3 Encounters:  01/20/16 159 lb 11.2 oz (72.439 kg)  01/16/16 158 lb 12.8 oz (72.031 kg)  01/09/16 159 lb 12.8 oz (72.485 kg)

## 2016-01-23 ENCOUNTER — Ambulatory Visit: Payer: Medicare Other

## 2016-01-23 ENCOUNTER — Ambulatory Visit
Admission: RE | Admit: 2016-01-23 | Discharge: 2016-01-23 | Disposition: A | Discharge status: Home / Self Care | Payer: Medicare Other | Source: Ambulatory Visit | Attending: Radiation Oncology | Admitting: Radiation Oncology

## 2016-01-23 DIAGNOSIS — Z510 Encounter for antineoplastic radiation therapy: Secondary | ICD-10-CM | POA: Diagnosis not present

## 2016-01-24 ENCOUNTER — Ambulatory Visit: Payer: Medicare Other

## 2016-01-25 ENCOUNTER — Encounter: Payer: Self-pay | Admitting: Radiation Oncology

## 2016-01-25 ENCOUNTER — Ambulatory Visit
Admission: RE | Admit: 2016-01-25 | Discharge: 2016-01-25 | Disposition: A | Discharge status: Home / Self Care | Payer: Medicare Other | Source: Ambulatory Visit | Attending: Radiation Oncology | Admitting: Radiation Oncology

## 2016-01-25 DIAGNOSIS — Z510 Encounter for antineoplastic radiation therapy: Secondary | ICD-10-CM | POA: Diagnosis not present

## 2016-01-26 MED FILL — OXYCODONE-APAP 10-325 TAB: 10-325 | 26 days supply | Qty: 160 | Fill #0

## 2016-02-01 ENCOUNTER — Telehealth: Payer: Self-pay | Admitting: *Deleted

## 2016-02-03 ENCOUNTER — Encounter: Payer: Self-pay | Admitting: Cardiology

## 2016-02-03 ENCOUNTER — Ambulatory Visit (INDEPENDENT_AMBULATORY_CARE_PROVIDER_SITE_OTHER): Payer: Medicare Other | Admitting: Cardiology

## 2016-02-03 VITALS — BP 130/62 | HR 72 | Ht 69.0 in | Wt 158.0 lb

## 2016-02-03 DIAGNOSIS — Z7901 Long term (current) use of anticoagulants: Secondary | ICD-10-CM | POA: Diagnosis not present

## 2016-02-03 DIAGNOSIS — I481 Persistent atrial fibrillation: Secondary | ICD-10-CM

## 2016-02-03 DIAGNOSIS — I340 Nonrheumatic mitral (valve) insufficiency: Secondary | ICD-10-CM

## 2016-02-03 DIAGNOSIS — I4819 Other persistent atrial fibrillation: Secondary | ICD-10-CM

## 2016-02-03 NOTE — Progress Notes (Signed)
[name] [address], [address], [place]  [address] Phone: [phone] Fax:  [phone]  Date:  02/03/2016   ID:  Sean Richardson, DOB 09-01-1932, MRN [id number]  PCP:  Henrine Screws, MD   History of Present Illness: Sean Richardson is a 82 y.o. male with persistent rate controlled atrial fibrillation, echocardiogram showing normal EF mild mitral regurgitation.  He has a history of COPD, no issues.  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  Atrial fibrillation was discovered on 06/01/09 during screening ECG which also demonstrated a right bundle branch block. Creatinine was 1.1. TSH normal. HDL 99. No strokes. He was placed on Pradaxa for anticoagulation. He has normal creatinine clearance. He has been checking his lab work through Dr. Inda Merlin. In regards to his COPD, he is stable and managed by Dr. Inda Merlin. In regards to hypertension he is doing well. He is taking care of his wife who is bed ridden do to CHF and osteoarthritis. Walking downtown. Lives at [employer] [facility]. No syncope. No bleeding. No chest pain. No palps. No syncope. No bleeding   12/21/14-unfortunately lost his wife in April 2016. No chest pain, no shortness of breath, no bleeding. He relayed blood work to me.  02/03/16-no changes in symptoms. Overall main issue now is a squamous cell carcinoma of right ear. Undergoing radiation, finishing. Worried about this.    Wt Readings from Last 3 Encounters:  02/03/16 158 lb (71.668 kg)  01/20/16 159 lb 11.2 oz (72.439 kg)  01/16/16 158 lb 12.8 oz (72.031 kg)     Past Medical History  Diagnosis Date  . COPD (chronic obstructive pulmonary disease) ([place])   . Seasonal rhinitis   . DJD (degenerative joint disease)     of the hands  . Skin cancer, basal cell   . Rosacea   . Sigmoid diverticulosis   . A-fib [facility])     Past Surgical History  Procedure Laterality Date  . Ear biopsy      Current Outpatient Prescriptions    Medication Sig Dispense Refill  . acetaminophen (TYLENOL) 500 MG tablet Take 1,000 mg by mouth every 6 (six) hours as needed. Reported on 01/16/2016    . aspirin 81 MG tablet Take 81 mg by mouth daily.    Marland Kitchen docusate sodium (COLACE) 100 MG capsule Take 100 mg by mouth 2 (two) times daily.    Marland Kitchen ELIQUIS 5 MG TABS tablet TAKE 1 TABLET (5 MG TOTAL) BY MOUTH 2 (TWO) TIMES DAILY. 60 tablet 1  . fexofenadine (ALLEGRA) 180 MG tablet Take 180 mg by mouth daily.    Marland Kitchen oxyCODONE-acetaminophen (PERCOCET) 10-325 MG tablet Take 1 tablet by mouth every 4 (four) hours as needed for pain. 160 tablet 0  . polyethylene glycol (MIRALAX / GLYCOLAX) packet Take 17 g by mouth daily.    Marland Kitchen PROAIR HFA 108 (90 BASE) MCG/ACT inhaler Inhale 1 puff into the lungs every 4 (four) hours as needed for wheezing or shortness of breath. Reported on 01/16/2016    . SYMBICORT 160-4.5 MCG/ACT inhaler Inhale 2 puffs into the lungs as needed (shortness of breath). Reported on 11/23/2015    . valsartan-hydrochlorothiazide (DIOVAN-HCT) 320-25 MG per tablet Take 1 tablet by mouth daily. Take 1 tab daily     No current facility-administered medications for this visit.    Allergies:   No Known Allergies  Social History:  The patient  reports that he has quit  smoking. He does not have any smokeless tobacco history on file.   Family History  Problem Relation Age of Onset  . Bladder Cancer Father   . Pneumonia Mother   . Lung cancer Father   . Brain cancer Brother     Glioblastoma    ROS:  Please see the history of present illness.   Denies any strokelike symptoms, syncope, chest pain, shortness of breath, bleeding  All other systems reviewed and negative.   PHYSICAL EXAM: VS:  BP 130/62 mmHg  Pulse 72  Ht 5\' 9"  (1.753 m)  Wt 158 lb (71.668 kg)  BMI 23.32 kg/m2 Thin, in no acute distress HEENT: normal, [place]/AT, EOMI Neck: no JVD, normal carotid upstroke, no bruit Cardiac: irreg irreg; no murmur(has mild MR) Lungs:  clear to  auscultation bilaterally, mild wheezing, rhonchi or rales Abd: soft, nontender, no hepatomegaly, no bruits Ext: no edema, 2+ distal pulses Skin: warm and dry GU: deferred Neuro: no focal abnormalities noted, AAO x 3  EKG:  Today 02/03/16-atrial fibrillation heart rate 73, right bundle branch block, no other specific abnormalities. Personally viewed , 12/21/14-atrial fibrillation heart rate 76, right bundle branch block, no significant change from prior.  Prior - atrial fibrillation with right bundle branch block 12/09/13-atrial fibrillation, 83, right bundle branch block, possible old inferior infarct pattern.  ECHO: 2010 -normal EF, mild mitral regurgitation   Labs: (Dr. Inda Merlin) Hg 13.8. Creat 0.9  ASSESSMENT AND PLAN:  1. Atrial fibrillation-currently well controlled. Continue with rate control strategy. Not on any rate controlling agent currently. Doing well on his own. 2. Chronic anticoagulation-Pradaxa. Monitor CBC and basic metabolic profile every 6 months. Dr. Inda Merlin has been monitoring his lab work. CHADSVASc-3 (HTN, Age 30). ASA 81. Aware of increased bleeding risk. Wants to continue ASA. We discussed once again. 3. COPD-stable 4. Mitral regurgitation-mild 5. Right bundle branch block-conduction disorder noted. No high-risk symptoms such as syncope. 6. Squamous cell carcinoma of right ear-he is finishing up radiation therapy. He states that this is very rare. He is very pleased with the care that he is getting at the cancer center. This is been very challenging for him. 7. One year f/u. He will contact me if any further worrisome symptoms develop.  Signed, Candee Furbish, MD [facility]  02/03/2016 8:02 AM

## 2016-02-03 NOTE — Patient Instructions (Signed)
Medication Instructions:  Please stop your ASA. Continue all other medications as listed.  Follow-Up: Follow up in 1 year with Dr. Marlou Porch.  You will receive a letter in the mail 2 months before you are due.  Please call us when you receive this letter to schedule your follow up appointment.  If you need a refill on your cardiac medications before your next appointment, please call your pharmacy.  Thank you for choosing [facility]!!

## 2016-02-03 NOTE — Telephone Encounter (Signed)
  Oncology Nurse Navigator Documentation  Navigator Location: [facility] On[facility] (02/01/16 1155) Navigator Encounter Type: Telephone;3 month (02/01/16 1155) Telephone: [place] Call (02/01/16 1155)                     Called Sean Richardson to check on his well being s/p completion of RT last week.  He reported:  Baseline sharp R ear pain of 2-3/10 for which he is taking Percocet q 6 hrs.  He indicated satisfaction with this status.  Occasional nausea but not of concern.  Improving appetite: e.g. tolerating meats (chicken beef), had waffles with syrup for breakfast this morning. Drinking milk, eating ice cream.  Fruits are not very agreeable at this point.  Having daily BMs with assistance of Miralax dissolved in [place] Dry ginger ale.  Applying Sonafine to treatment area BID.  He denied concerns about skin integrity. He confirmed understanding of 02/22/16 11:50 follow-up appt with Dr. Isidore Moos. He denied needs or concerns at this time, I encouraged him to contact me if that changes, he verbalized understanding.  Gayleen Orem, RN, BSN, [place] [facility] [place] [phone]                        Time Spent with Patient: 15 (02/01/16 1155)

## 2016-02-14 NOTE — Progress Notes (Signed)
  Radiation Oncology         [phone]) [phone] ________________________________  Name: Sean Richardson MRN: [id number]  Date: 01/25/2016  DOB: 1932-10-05  End of Treatment Note  Diagnosis:    ICD-9-CM ICD-10-CM   1. Squamous cell carcinoma of skin of right ear and external auditory canal 173.22 C44.222        [id number] right external auditory canal squamous cell carcinoma, Stage I   [id number]  Indication for treatment: curative      Radiation treatment dates:   12/07/2015-01/25/2016  Site/dose:   Right ear canal/ pre-auricular, 66 Gy in 33 fractions.    Beams/energy:   IMRT, 6X   Narrative: The patient tolerated radiation treatment relatively well.     Plan: The patient has completed radiation treatment. The patient will return to radiation oncology clinic for routine followup in one month. I advised them to call or return sooner if they have any questions or concerns related to their recovery or treatment.  -----------------------------------  Eppie Gibson, MD   This document serves as a record of services personally performed by Eppie Gibson , MD. It was created on his behalf by Truddie Hidden, a trained medical scribe. The creation of this record is based on the scribe's personal observations and the provider's statements to them. This document has been checked and approved by the attending provider.

## 2016-02-16 NOTE — Progress Notes (Signed)
  Sean Richardson presents for follow up of radiation completed 01/25/16 to his Right ear canal/ Preauricular areas   Pain issues, if any: He denies pain at this time. He reports that he does pain in the late afternoon, and at bedtime in his Right Ear. He will take one percocet at each of these times with relief. He occasionally takes a percocet in the morning.  Using a feeding tube?: No Weight changes, if any:  Wt Readings from Last 3 Encounters:  02/22/16 160 lb 3.2 oz (72.7 kg)  02/03/16 158 lb (71.7 kg)  01/20/16 159 lb 11.2 oz (72.4 kg)   Swallowing issues, if any: No, he is eating well. He does report his sense of taste is different. He is eating chicken and fish for protein.  Smoking or chewing tobacco? No Using fluoride trays daily? No Last ENT visit was on: Melony Overly MD. He will see him soon.  Other notable issues, if any:   His skin at his radiation site has healed. He still has a cotton ball in his ear and reports a scant amount of drainage that is yellow in color.   BP 126/71   Pulse 80   Temp 98.1 F (36.7 C)   Wt 160 lb 3.2 oz (72.7 kg)   SpO2 98% Comment: room air  BMI 23.66 kg/m

## 2016-02-22 ENCOUNTER — Ambulatory Visit
Admission: RE | Admit: 2016-02-22 | Discharge: 2016-02-22 | Disposition: A | Discharge status: Home / Self Care | Payer: Medicare Other | Source: Ambulatory Visit | Attending: Radiation Oncology | Admitting: Radiation Oncology

## 2016-02-22 ENCOUNTER — Encounter: Payer: Self-pay | Admitting: Radiation Oncology

## 2016-02-22 VITALS — BP 126/71 | HR 80 | Temp 98.1°F | Wt 160.2 lb

## 2016-02-22 DIAGNOSIS — C44222 Squamous cell carcinoma of skin of right ear and external auricular canal: Secondary | ICD-10-CM | POA: Diagnosis present

## 2016-02-22 MED ORDER — OXYCODONE-ACETAMINOPHEN 5-325 MG PO TABS: 1.0000 | ORAL_TABLET | Freq: Three times a day (TID) | ORAL | 0 refills | Status: DC | PRN

## 2016-02-22 NOTE — Progress Notes (Signed)
Radiation Oncology         [phone]) [phone] ________________________________  Name: Sean Richardson MRN: [id number]  Date: 02/22/2016  DOB: 1933-06-23  Follow-Up Visit Note  CC: Henrine Screws, MD  Rozetta Nunnery, *    ICD-9-CM ICD-10-CM   1. Squamous cell carcinoma of skin of right ear and external auditory canal 173.22 C44.222 oxyCODONE-acetaminophen (PERCOCET/ROXICET) 5-325 MG tablet     CT TEMPORAL BONES WO CONTRAST     Diagnosis and Prior Radiotherapy:    Squamous cell carcinoma of skin of right ear and external auditory canal, completed treatment on 01/25/2016, about one month ago. Patient received 66 Gy to his Right ear.     Narrative:  The patient returns today for routine follow-up. He denies pain at this time. He reports that he does pain in the late afternoon, and at bedtime in his Right Ear. He will take one percocet at each of these times with relief. He occasionally takes a percocet in the morning. Patient reported after he finished treatment he was in severe pain and didn't have much of an appetite, but also reports that his pain has greatly improved in the past 2-3 weeks with the aid of one pill during percocet for relief.  Otherwise, he does not take anything.  He describes this pain as a throbbing sensation, and also says that he takes this medication at the onset of this pain.  Patient reports that he has some remaining draining from his right ear.  The patient reports he will schedule an appointment with Dr. Lucia Gaskins later on today.                      ALLERGIES:  has No Known Allergies.  Meds: Current Outpatient Prescriptions  Medication Sig Dispense Refill  . acetaminophen (TYLENOL) 500 MG tablet Take 1,000 mg by mouth every 6 (six) hours as needed. Reported on 01/16/2016    . aspirin 81 MG tablet Take 81 mg by mouth daily.    Marland Kitchen ELIQUIS 5 MG TABS tablet TAKE 1 TABLET (5 MG TOTAL) BY MOUTH 2 (TWO) TIMES DAILY. 60 tablet 1  . fexofenadine (ALLEGRA) 180 MG tablet Take  180 mg by mouth daily.    . polyethylene glycol (MIRALAX / GLYCOLAX) packet Take 17 g by mouth daily.    Marland Kitchen PROAIR HFA 108 (90 BASE) MCG/ACT inhaler Inhale 1 puff into the lungs every 4 (four) hours as needed for wheezing or shortness of breath. Reported on 01/16/2016    . SYMBICORT 160-4.5 MCG/ACT inhaler Inhale 2 puffs into the lungs as needed (shortness of breath). Reported on 11/23/2015    . valsartan-hydrochlorothiazide (DIOVAN-HCT) 320-25 MG per tablet Take 1 tablet by mouth daily. Take 1 tab daily    . docusate sodium (COLACE) 100 MG capsule Take 100 mg by mouth 2 (two) times daily.    Marland Kitchen oxyCODONE-acetaminophen (PERCOCET/ROXICET) 5-325 MG tablet Take 1-2 tablets by mouth 3 (three) times daily as needed for severe pain. 60 tablet 0   No current facility-administered medications for this encounter.     Physical Findings: The patient is in no acute distress. Patient is alert and oriented. Wt Readings from Last 3 Encounters:  02/22/16 160 lb 3.2 oz (72.7 kg)  02/03/16 158 lb (71.7 kg)  01/20/16 159 lb 11.2 oz (72.4 kg)    weight is 160 lb 3.2 oz (72.7 kg). His temperature is 98.1 F (36.7 C). His blood pressure is 126/71 and his pulse is 80. His oxygen  saturation is 98%. .  General: Alert and oriented, in no acute distress HEENT: The tumor has significantly regressed since I last saw the patient in his last week of treatment.  There is significantly more patency in the ear canal and I still see , deeper in the ear canal, whitish moist tissue.  But this has regressed back significantly as well.     Lab Findings: No results found for: WBC, HGB, HCT, MCV, PLT  No results found for: TSH  Radiographic Findings: No results found.  Impression/Plan:    1) Head and Neck Cancer Status:  Continued response to radiation therapy but possible residual tumor that has not completely responded thus far. Nevertheless, it has been less than 1 month since treatment completion  2.) Nutrition: Weight  is stable, no nutritional issues.   3) Pain - improved.  Refill provided today on percocet at reduced dose.  3)  Follow-up in 2 months with CT. The patient was encouraged to call with any issues or questions before then.   He will be followed closely between myself and ENT.  I recommended the patient to see his ENT around the first week of September/ last week of August.   I will also see the patient in two months from now with an additional CT scan around then, and also told the patient that if his ENT wanted to move this up, he could. I also instructed the patient to continue to take his pain medication 2-3 times as needed.  _____________________________________   Eppie Gibson, MD   This document serves as a record of services personally performed by Eppie Gibson , MD. It was created on her behalf by Truddie Hidden, a trained medical scribe. The creation of this record is based on the scribe's personal observations and the provider's statements to them. This document has been checked and approved by the attending provider.

## 2016-02-28 ENCOUNTER — Telehealth: Payer: Self-pay | Admitting: *Deleted

## 2016-02-28 NOTE — Telephone Encounter (Signed)
Called patient to inform of Ct for 04-26-16- arrival time - 10:45 am , no restrictions to test, and his fu with Dr. Isidore Moos on 04-27-16 @ 11 am to get his results, spoke with patient and he is aware of these appts.

## 2016-02-29 ENCOUNTER — Telehealth: Payer: Self-pay | Admitting: *Deleted

## 2016-02-29 MED FILL — OXYCODONE/APAP 5/325MG: 5-325 | 10 days supply | Qty: 60 | Fill #0

## 2016-02-29 NOTE — Telephone Encounter (Signed)
  Oncology Nurse Navigator Documentation  Per Dr. Pearlie Oyster guidance, called ENT Dr. Pollie Friar office to arrange follow-up appt.   Spoke with Susie, requested that patient be contacted and appt arranged to see Dr. Lucia Gaskins  in 3-4 weeks.  She verbalized understanding.  Gayleen Orem, RN, BSN, [place] [facility] [phone]    Navigator Location: [place] (02/29/16 1405) Navigator Encounter Type: Telephone (02/29/16 1405) Telephone: [place] Call (02/29/16 1405)           Treatment Phase: Post-Tx Follow-up (02/29/16 1405) Barriers/Navigation Needs: Coordination of Care (02/29/16 1405)                          Time Spent with Patient: 15 (02/29/16 1405)

## 2016-03-01 ENCOUNTER — Other Ambulatory Visit: Payer: Self-pay | Admitting: Cardiology

## 2016-03-22 MED FILL — NEO/POLYMYXIN/HC EAR SOLN: 3.5-10000-1 | 20 days supply | Qty: 10 | Fill #0

## 2016-04-06 MED FILL — NEO/POLYMYXIN/HC EAR SOLN: 3.5-10000-1 | 25 days supply | Qty: 10 | Fill #0

## 2016-04-06 MED FILL — OXYCODONE/APAP 5/325MG: 5-325 | 6 days supply | Qty: 24 | Fill #0

## 2016-04-11 ENCOUNTER — Other Ambulatory Visit: Payer: Self-pay | Admitting: Otolaryngology

## 2016-04-11 DIAGNOSIS — C44222 Squamous cell carcinoma of skin of right ear and external auricular canal: Secondary | ICD-10-CM

## 2016-04-16 ENCOUNTER — Ambulatory Visit
Admission: RE | Admit: 2016-04-16 | Discharge: 2016-04-16 | Disposition: A | Discharge status: Home / Self Care | Payer: Medicare Other | Source: Ambulatory Visit | Attending: Otolaryngology | Admitting: Otolaryngology

## 2016-04-16 DIAGNOSIS — C44222 Squamous cell carcinoma of skin of right ear and external auricular canal: Secondary | ICD-10-CM

## 2016-04-16 MED ORDER — IOPAMIDOL (ISOVUE-300) INJECTION 61%
80.0000 mL | Freq: Once | INTRAVENOUS | Status: AC | PRN
  Administered 2016-04-16: 80 mL via INTRAVENOUS

## 2016-04-18 ENCOUNTER — Telehealth: Payer: Self-pay | Admitting: *Deleted

## 2016-04-18 NOTE — Telephone Encounter (Signed)
CALLED PATIENT TO INFORM THAT DR. SQUIRE HAD ME CANCEL HIS CT ON 04-26-16 BECAUSE DR. Lucia Gaskins GOT ONE EARLIER, I ALSO INFORMED HIM THAT DR. SQUIRE STILL WANTS TO SEE HIM ON 04-27-16, PT. STATED BEING AGREEABLE WITH THIS.

## 2016-04-24 NOTE — Progress Notes (Signed)
Sean Richardson presents for follow up of radiation to his Right ear canal/pre-auricular completed 01/25/16  Pain issues, if any?: He reports constant Right Ear at a 5/10. He is taking 500mg  of Tylenol three times a day. He is taking 5/325 of Percocet at 4 pm and 8 pm. He also tells me that heat and chewing gum helps his pain.  Using a feeding tube?: No Weight changes, if any: He is eating well. He reports continued taste changes.  Wt Readings from Last 3 Encounters:  04/27/16 159 lb 6.4 oz (72.3 kg)  02/22/16 160 lb 3.2 oz (72.7 kg)  02/03/16 158 lb (71.7 kg)   Swallowing issues, if any: No Smoking or chewing tobacco? No Using fluoride trays daily? No Last ENT visit was on: Dr. Lucia Gaskins 04/04/16, Biopsies taken. Dr. Lucia Gaskins notes on 04/10/16 document that he discussed the path report with the patient. It was positive for SCCA. He was offered surgery, which he reports to be "an extensive surgery". He is struggling with his decision about having surgery.  Other notable issues, if any:  04/16/16 CT Temporal Bones & Neck  BP (!) 145/76   Pulse 80   Temp 98.3 F (36.8 C)   Ht 5\' 9"  (1.753 m)   Wt 159 lb 6.4 oz (72.3 kg)   SpO2 99% Comment: room air  BMI 23.54 kg/m

## 2016-04-25 NOTE — Progress Notes (Addendum)
Radiation Oncology         [phone]) [phone] ________________________________  Name: Sean Richardson MRN: [id number]  Date: 04/27/2016  DOB: [birth year]  Follow-Up Visit Note  CC: Henrine Screws, MD  Rozetta Nunnery, *    Diagnosis and Prior Radiotherapy:   ICD-9-CM ICD-10-CM   1. 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  Narrative:  The patient returns today for routine follow-up. CT of the soft tissue of the neck w/ contrast on 04/16/16 showed small right posterior level 2 lymph nodes that may represent small fatty contained lymph nodes vs tiny necrotic lymph nodes, bilateral carotid bifurcation calcifications with narrowing greater on the right, and progressive chronic biapical parenchymal changes/pleural thickening greater on the right may be related to a chronic inflammatory/infectious process rather than tumor.  CT of the temporal bones w/ contrast on 04/16/16 showed progressive opacification of the right external auditory canal containing calcifications (this raises possibility of progressive tumor vs combination of treated tumor and response to treatment, irregularity of the superior bony contour of the right external artery canal without significant change, new small soft tissue nonspecific process located between the right ossicles and scutum without bony erosion, opacification right mastoid air cells (no bony destruction), and opacification aerated aspect of the right petrous apex.  Pain issues, if any?: He reports constant Right Ear pain as a 5/10. He is taking 500mg  of Tylenol three times a day. He is taking 5/325 of Percocet at 4 pm and 8 pm. He is requesting a refill of pain medication. He also tells me that heat and chewing  gum helps his pain.  Using a feeding tube?: No Weight changes, if any: He is eating well. He reports continued taste changes.     Wt Readings from Last 3 Encounters:  04/27/16 159 lb 6.4 oz (72.3 kg)  02/22/16 160 lb 3.2 oz (72.7 kg)  02/03/16 158 lb (71.7 kg)   Swallowing issues, if any: No Smoking or chewing tobacco? No Using fluoride trays daily? No Last ENT visit was on: I spoke with Dr. Lucia Gaskins on the phone. On 04/04/16, the patient has biopsies of the right neck taken by Dr. Lucia Gaskins. Dr. Lucia Gaskins notes on 04/10/16 document that he discussed the path report with the patient. Per Dr Lucia Gaskins It was positive for SCCA. He was offered surgery, which he reports to be "an extensive surgery". He is struggling with his decision about having surgery.  ALLERGIES:  has No Known Allergies.  Meds: Current Outpatient Prescriptions  Medication Sig Dispense Refill  . acetaminophen (TYLENOL) 500 MG tablet Take 1,000 mg by mouth every 6 (six) hours as needed. Reported on 01/16/2016    . apixaban (ELIQUIS) 5 MG TABS tablet Take 1 tablet (5 mg total) by mouth 2 (two) times daily. 60 tablet 11  . aspirin 81 MG tablet Take 81 mg by mouth daily.    . fexofenadine (ALLEGRA) 180 MG tablet Take 180 mg by mouth daily.    . polyethylene glycol (MIRALAX / GLYCOLAX) packet Take 17 g by mouth daily.    Marland Kitchen PROAIR HFA 108 (90 BASE) MCG/ACT inhaler Inhale 1 puff into the lungs every 4 (four) hours as needed for wheezing or shortness of breath. Reported on 01/16/2016    . SYMBICORT 160-4.5 MCG/ACT inhaler Inhale  2 puffs into the lungs as needed (shortness of breath). Reported on 11/23/2015    . valsartan-hydrochlorothiazide (DIOVAN-HCT) 320-25 MG per tablet Take 1 tablet by mouth daily. Take 1 tab daily    . docusate sodium (COLACE) 100 MG capsule Take 100 mg by mouth 2 (two) times daily.    Marland Kitchen oxyCODONE-acetaminophen (PERCOCET) 10-325 MG tablet Take 1-2 tablets q 4 hrs prn pain. 120 tablet 0   No current facility-administered  medications for this encounter.     Physical Findings: The patient is in no acute distress. Patient is alert and oriented. Wt Readings from Last 3 Encounters:  04/27/16 159 lb 6.4 oz (72.3 kg)  02/22/16 160 lb 3.2 oz (72.7 kg)  02/03/16 158 lb (71.7 kg)    height is 5\' 9"  (1.753 m) and weight is 159 lb 6.4 oz (72.3 kg). His temperature is 98.3 F (36.8 C). His blood pressure is 145/76 (abnormal) and his pulse is 80. His oxygen saturation is 99%. .  General: Alert and oriented, in no acute distress HEENT: Some moist tissue in the middle right ear concerning for residual tumor.  Neck demonstrates no palpable masses. No palpable post or preauricular masses. Skin healed well over external ear, right, in RT fields  Lab Findings: No results found for: WBC, HGB, HCT, MCV, PLT  No results found for: TSH  Radiographic Findings: Ct Soft Tissue Neck W Contrast  Result Date: 04/16/2016 CLINICAL DATA:  82 year old male with stage I external auditory canal squamous cell carcinoma treated with radiation therapy. Subsequent encounter. Creatinine was obtained on site at [facility] at [address]Results: Creatinine 0.9 mg/dL. EXAM: CT NECK WITH CONTRAST TECHNIQUE: Multidetector CT imaging of the neck was performed using the standard protocol following the bolus administration of intravenous contrast. CONTRAST:  72mL ISOVUE-300 IOPAMIDOL (ISOVUE-300) INJECTION 61% COMPARISON:  Temporal bone CT 10/21/2015.  04/05/2004 chest CT. Temporal bone CT performed same date dictated separately. FINDINGS: Pharynx and larynx: No primary pharynx or laryngeal mass. Salivary glands: No primary parotid or submandibular gland mass. Thyroid: No primary thyroid mass. Lymph nodes: Small right posterior level 2 lymph nodes (series 2, image 38 and series 5, image 38) may represent small fatty containing lymph nodes versus tiny necrotic lymph nodes. No other findings of adenopathy. Vascular: Bilateral carotid  bifurcation calcifications with narrowing greater on the right (64 percent diameter stenosis right carotid bifurcation/ proximal right internal carotid artery). Limited intracranial: No parenchymal enhancing lesion of visualized brain. Visualized orbits: No orbital abnormality noted. Mastoids and visualized paranasal sinuses: Please see petrous temporal bone CT report performed same date and dictated separately. Skeleton: Mild transverse ligament hypertrophy. Mild spondylotic changes included C3-4 right paracentral protrusion and slight cord flattening. Upper chest: Right upper lobe/apical consolidation has progressed since 2005. This contains bullae/ blebs and calcifications in addition to pleural thickening. The fact that there were changes in the right lung apex on remote examinations in addition to calcification suggests that changes may be related to a chronic inflammatory/infection process rather than tumor. Less notable left apical pleural thickening. No associated bony destruction. Other: Negative. IMPRESSION: Please see petrous temporal bone CT performed same date and dictated separately. Small right posterior level 2 lymph nodes may represent small fatty containing lymph nodes versus tiny necrotic lymph nodes. Bilateral carotid bifurcation calcifications with narrowing greater on the right (64 % diameter stenosis right carotid bifurcation/ proximal right internal carotid artery). Progressive chronic biapical parenchymal changes/ pleural thickening greater on the right may be related to a  chronic inflammatory/infectious process rather than tumor (as some of these changes were noted on 2005 exam). Electronically Signed   By: Genia Del M.D.   On: 04/16/2016 17:29   Ct Temporal Bones W Contrast  Result Date: 04/16/2016 CLINICAL DATA:  82 year old male with stage I external auditory canal squamous cell carcinoma treated with radiation therapy. Subsequent encounter. Creatinine was obtained on site at  [facility] at [address]Results: Creatinine 0.9 mg/dL. EXAM: CT TEMPORAL BONES WITH CONTRAST TECHNIQUE: Axial and coronal plane CT imaging of the petrous temporal bones was performed with thin-collimation image reconstruction after intravenous contrast administration. Multiplanar CT image reconstructions were also generated. CONTRAST:  80 cc Isovue 300. COMPARISON:  10/21/2015. CT neck performed same date dictated separately. FINDINGS: Right-side: Progressive opacification of the right external auditory canal containing calcifications. This raises possibility of progressive tumor as versus combination of treated tumor and response to treatment. Irregularity of the superior bony contour of the right external artery canal without significant change. New small soft tissue nonspecific process located between the right ossicles and scutum without bony erosion. This will need to be monitored given the close proximity to the patient tumor. Opacification right mastoid air cells.  No bony destruction. Opacification aerated aspect of the right petrous apex Left-side: Clear left middle ear and left mastoid air cells. Left external auditory canal is clear. Intracranial atrophy. No enhancing lesion of visualized intracranial structures or orbital structures. Cavernous segment carotid artery calcifications and narrowing. Mucosal thickening left maxillary sinus measuring up to 4 mm. IMPRESSION: Progressive opacification of the right external auditory canal containing calcifications. This raises possibility of progressive tumor as versus combination of treated tumor and response to treatment. Irregularity of the superior bony contour of the right external artery canal without significant change. New small soft tissue nonspecific process located between the right ossicles and scutum without bony erosion. This will need to be monitored given the close proximity to the patient tumor. Opacification right mastoid air  cells.  No bony destruction. Opacification aerated aspect of the right petrous apex. Electronically Signed   By: Genia Del M.D.   On: 04/16/2016 17:27    Impression/Plan:    1) Head and Neck Cancer Status: persistent disease despite aggressive radiotherapy  2.) Nutrition: Weight is stable, no nutritional issues.   3) Pain: Constant right ear pain. I will prescribe Percocet 10-325 1-2 tabs q 4hrs  for the pain.  4) The patient is scheduled to follow up with Dr. Lucia Gaskins next week to discuss surgery.   5)  I advised the patient to see a palliative care specialist.He would like to hold off on a referral  6) I will see him PRN after he figures out his surgical decision.  The patient was encouraged to call with any issues or questions before then. I will ask Gayleen Orem, RN, our Head and Neck Oncology Navigator to reach out to him.  20 minutes spent face to face w/ patient, over 50% on counseling and care coordination. _____________________________________   Eppie Gibson, MD  This document serves as a record of services personally performed by Eppie Gibson, MD. It was created on her behalf by Darcus Austin, a trained medical scribe. The creation of this record is based on the scribe's personal observations and the provider's statements to them. This document has been checked and approved by the attending provider.

## 2016-04-26 ENCOUNTER — Ambulatory Visit (HOSPITAL_COMMUNITY): Payer: Medicare Other

## 2016-04-27 ENCOUNTER — Encounter: Payer: Self-pay | Admitting: Radiation Oncology

## 2016-04-27 ENCOUNTER — Ambulatory Visit
Admission: RE | Admit: 2016-04-27 | Discharge: 2016-04-27 | Disposition: A | Discharge status: Home / Self Care | Payer: Medicare Other | Source: Ambulatory Visit | Attending: Radiation Oncology | Admitting: Radiation Oncology

## 2016-04-27 VITALS — BP 145/76 | HR 80 | Temp 98.3°F | Ht 69.0 in | Wt 159.4 lb

## 2016-04-27 DIAGNOSIS — Z7901 Long term (current) use of anticoagulants: Secondary | ICD-10-CM | POA: Diagnosis not present

## 2016-04-27 DIAGNOSIS — Z923 Personal history of irradiation: Secondary | ICD-10-CM | POA: Insufficient documentation

## 2016-04-27 DIAGNOSIS — H9201 Otalgia, right ear: Secondary | ICD-10-CM | POA: Diagnosis not present

## 2016-04-27 DIAGNOSIS — C44222 Squamous cell carcinoma of skin of right ear and external auricular canal: Secondary | ICD-10-CM | POA: Diagnosis present

## 2016-04-27 DIAGNOSIS — Z7982 Long term (current) use of aspirin: Secondary | ICD-10-CM | POA: Diagnosis not present

## 2016-04-27 MED ORDER — OXYCODONE-ACETAMINOPHEN 10-325 MG PO TABS: ORAL_TABLET | ORAL | 0 refills | Status: DC

## 2016-05-01 MED FILL — OXYCODONE-APAP 10-325: 10-325 | 10 days supply | Qty: 120 | Fill #0

## 2016-05-23 ENCOUNTER — Encounter: Payer: Self-pay | Admitting: *Deleted

## 2016-05-23 NOTE — Progress Notes (Signed)
Oncology Nurse Navigator Documentation  Called Sean Richardson to check on his well-being.  He reported "Not doing well, ear pain is enormous".  He stated he is taking 1-2 Percocet in the morning and at HS before bedtime, waits until pain becomes unbearable.  He does not take throughout the day, expressed concern about becoming dependent if he did. I assured him he would not become dependent if taken as prescribed.  I asked if he would like a referral to [facility] for a second opinion re surgery recommended by Dr. Lucia Gaskins.  He denied interest, stated he knows he needs surgery, wants to have done in [place].  "I want to wait until after the holidays so I can enjoy Thanksgiving and Christmas with my family if I can make it until then."  We discussed the pros and cons of surgery now or early next year.  He expressed certainty that he wanted to wait until early next year.  We spoke at length that improving pain control will improve his daily quality of life which is important if he wants to enjoy time with his family this holiday season.  He voiced understanding. He agreed to my encouragement to continue taking 2 tablets Percocet in AM and HS and to initiate taking 1-2 tablets q 4 hrs during the day as prescribed.  He understands I will call him next week to see how this regime is helping.  In context of increased Percocet use, I encouraged him to increase use of Miralax to avoid constipation.  He indicated he currently takes daily dose, has daily BMs, but will increase to BID as necessary. He thanked me for my call, noted "talking with you is just what I needed".  Gayleen Orem, RN, BSN, [place] [facility] [place] [phone]

## 2016-05-30 ENCOUNTER — Other Ambulatory Visit: Payer: Self-pay | Admitting: Radiation Oncology

## 2016-05-30 DIAGNOSIS — C44222 Squamous cell carcinoma of skin of right ear and external auricular canal: Secondary | ICD-10-CM

## 2016-05-30 MED ORDER — OXYCODONE-ACETAMINOPHEN 10-325 MG PO TABS: ORAL_TABLET | ORAL | 0 refills | Status: DC

## 2016-05-31 MED FILL — OXYCODONE-APAP 10-325: 10-325 | 15 days supply | Qty: 180 | Fill #0

## 2016-06-05 MED FILL — NEO/POLYMYXIN/HC EAR SOLN: 3.5-10000-1 | 7 days supply | Qty: 10 | Fill #1

## 2016-06-12 ENCOUNTER — Telehealth: Payer: Self-pay | Admitting: *Deleted

## 2016-06-12 NOTE — Telephone Encounter (Signed)
Oncology Nurse Navigator Documentation  Received call from niece Jinny Blossom expressing concern for her uncle, indicated he is experiencing L neck lymph node swelling, increased swelling anterior to L ear, dysphagia.  She asked me to call him but not let him know she had called me.  I called [facility], he confirmed her concerns.  He further noted:  Left ear/neck inflamed, tender to touch.    Filled today a prednisone Rx written by his PCP Dr. Inda Merlin "6 years ago to help with breathing if I needed it", began prescribed regimen this afternoon to help with swelling.  Limiting daytime analgesic to tylenol because "I want to be able to drive without being drowsy". I encouraged an appt with Dr. Isidore Moos or Survivorship NP Mike Craze for further assessment of his symptoms. After considerable hesitation, he agreed, asked for a morning appt. I conferred with Dr. Isidore Moos, she agreed to see him tomorrow at 9:00 AM.  I confirmed appt with Ernie Hew, he asked that I join him which I agreed to do. I called his niece to inform her of the appt.  She expressed appreciation.  Gayleen Orem, RN, BSN, [place] [facility] [place] [phone]

## 2016-06-12 NOTE — Progress Notes (Signed)
  Mr. Stiltner presents for follow up of radiation completed 01/25/16 to his Right ear Canal/ Pre-auricular.   Pain issues, if any: He reports pain in his Right Ear a 5/10 today. He is taking 2 percocet at 4pm, 8pm, and 12am. He is taking alleve 220 mg at 4 am.  Using a feeding tube?: No Weight changes, if any:  Wt Readings from Last 3 Encounters:  06/13/16 160 lb 12.8 oz (72.9 kg)  04/27/16 159 lb 6.4 oz (72.3 kg)  02/22/16 160 lb 3.2 oz (72.7 kg)   Swallowing issues, if any: He is eating well.  Smoking or chewing tobacco? No Using fluoride trays daily? No Last ENT visit was on: Dr. Lucia Gaskins 05/02/16 and spoke with him on 06/07/16 via telephone.  Other notable issues, if any:  He started taking an old prescription for prednisone he had at home. He started taking it yesterday and is following the instructions on the bottle. They are 10 mg tablets and he started taking it yesterday. 5 tablets the first day and decrease by 1 tablet every other day. He reports the swelling to the right side of his face has decreased since he started taking them yesterday, and his pain has improved.   He is requesting another tube of sonafine today, as it helped improve his skin and pain also.   BP 135/75   Pulse 85   Temp 97.9 F (36.6 C)   Ht 5\' 9"  (1.753 m)   Wt 160 lb 12.8 oz (72.9 kg)   SpO2 100% Comment: room air  BMI 23.75 kg/m

## 2016-06-13 ENCOUNTER — Encounter: Payer: Self-pay | Admitting: *Deleted

## 2016-06-13 ENCOUNTER — Ambulatory Visit
Admission: RE | Admit: 2016-06-13 | Discharge: 2016-06-13 | Disposition: A | Discharge status: Home / Self Care | Payer: Medicare Other | Source: Ambulatory Visit | Attending: Radiation Oncology | Admitting: Radiation Oncology

## 2016-06-13 ENCOUNTER — Encounter: Payer: Self-pay | Admitting: Radiation Oncology

## 2016-06-13 VITALS — BP 135/75 | HR 85 | Temp 97.9°F | Ht 69.0 in | Wt 160.8 lb

## 2016-06-13 DIAGNOSIS — R221 Localized swelling, mass and lump, neck: Secondary | ICD-10-CM | POA: Diagnosis not present

## 2016-06-13 DIAGNOSIS — Z7982 Long term (current) use of aspirin: Secondary | ICD-10-CM | POA: Insufficient documentation

## 2016-06-13 DIAGNOSIS — Z5189 Encounter for other specified aftercare: Secondary | ICD-10-CM | POA: Insufficient documentation

## 2016-06-13 DIAGNOSIS — Z79899 Other long term (current) drug therapy: Secondary | ICD-10-CM | POA: Diagnosis not present

## 2016-06-13 DIAGNOSIS — C44222 Squamous cell carcinoma of skin of right ear and external auricular canal: Secondary | ICD-10-CM | POA: Insufficient documentation

## 2016-06-13 MED ORDER — PREDNISONE 10 MG PO TABS: ORAL_TABLET | ORAL | 0 refills | Status: DC

## 2016-06-13 MED FILL — predniSONE 10 MG TABS: 10 | 6 days supply | Qty: 40 | Fill #0

## 2016-06-13 NOTE — Progress Notes (Addendum)
Radiation Oncology         [phone]) [phone] ________________________________  Name: Sean Richardson MRN: [id number]  Date: 06/13/2016  DOB: Dec 05, 1932  Follow-Up Visit Note  CC: Henrine Screws, MD  Rozetta Nunnery, *   Diagnosis and Prior Radiotherapy:   ICD-9-CM ICD-10-CM   1. 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  Chief Complaint:  Here due to extreme pain in right ear and new neck swelling  Narrative:  The patient returns today for self-requested follow-up.  CT of the soft tissue of the neck w/ contrast on 04/16/16 showed small right posterior level 2 lymph nodes that may represent small fatty contained lymph nodes vs tiny necrotic lymph nodes, bilateral carotid bifurcation calcifications with narrowing greater on the right, and progressive chronic biapical parenchymal changes/pleural thickening greater on the right may be related to a chronic inflammatory/infectious process rather than tumor.  CT of the temporal bones w/ contrast on 04/16/16 showed progressive opacification of the right external auditory canal containing calcifications (this raises possibility of progressive tumor vs combination of treated tumor and response to treatment, irregularity of the superior bony contour of the right external artery canal without significant change, new small soft tissue nonspecific process located between the right ossicles and scutum without bony erosion, opacification right mastoid air cells (no bony destruction), and opacification aerated aspect of the right petrous apex.  The patient's niece called Gayleen Orem, RN, our Head and Neck Oncology Navigator concerning the health of her uncle yesterday. She noted neck swelling and dysphagia. The neck and  ear were tender to palpation. The patient started taking a Prednisone prescription by his PCP, Dr. Inda Merlin, that is 80 years old to help with the swelling. The patient presents today to discuss his symptoms.  On 05/23/16, the patient spoke with Gerald Dexter, RN over the phone about terrible right ear pain in which the patient agreed to continue taking 2 tablets of Percocet BID and initiate taking 1-2 tablets every 4 hours during the day as prescribed. He was asked if he would like to be referred to [facility] for a second opinion regarding surgery recommended by Dr. Lucia Gaskins. The patient stated he wanted to wait after the holidays and to possibly have the surgery done in [place] or [place].  Pain issues, if any: He reports pain in his right ear and neck as a 5/10 today, but it was worse yesterday. He is taking two Percocet at 4PM, 8PM, and 12AM. He is taking Aleve 220 mg at 4AM. Using a feeding tube?: No Weight changes, if any:     Wt Readings from Last 3 Encounters:  06/13/16 160 lb 12.8 oz (72.9 kg)  04/27/16 159 lb 6.4 oz (72.3 kg)  02/22/16 160 lb 3.2 oz (72.7 kg)   Swallowing issues, if any: He is eating well.  Smoking or chewing tobacco? No Using fluoride trays daily? No Last ENT visit was on: Dr. Lucia Gaskins on 05/02/16 and he spoke with him on 06/07/16 via telephone. On 04/04/16, the patient had biopsies of the right neck taken by Dr. Lucia Gaskins. Dr. Lucia Gaskins notes in the 04/10/16 document that he discussed the path report with the patient. per Dr. Lucia Gaskins, it was positive for SCCA. He was offered surgery, which he reports to be "an extensive surgery". He  is struggling with his decision about having surgery.  Other notable issues, if any: He started taking an old prescription for prednisone he had at home. He started taking it yesterday and is following the instructions on the bottle. They are 10 mg tablets and he started taking it yesterday. 5 tablets the first day and decrease by 1 tablet every other day. He  reports the swelling to the right side of his face has decreased since he started taking them yesterday, and his pain has improved. He is requesting another tube of sonafine today, as it helped improve his skin and pain also.  The patient's niece and Gayleen Orem, RN, our Head and Neck Oncology Navigator were present during the encounter.  ALLERGIES:  has No Known Allergies.  Meds: Current Outpatient Prescriptions  Medication Sig Dispense Refill  . apixaban (ELIQUIS) 5 MG TABS tablet Take 1 tablet (5 mg total) by mouth 2 (two) times daily. 60 tablet 11  . ciprofloxacin (CIPRO) 500 MG tablet Take 500 mg by mouth 2 (two) times daily. He started 06/08/16    . fexofenadine (ALLEGRA) 180 MG tablet Take 180 mg by mouth daily.    Marland Kitchen oxyCODONE-acetaminophen (PERCOCET) 10-325 MG tablet Take 1-2 tablets q 4 hrs prn pain. 180 tablet 0  . polyethylene glycol (MIRALAX / GLYCOLAX) packet Take 17 g by mouth daily.    . predniSONE (DELTASONE) 10 MG tablet Taper as directed. 40 tablet 0  . PROAIR HFA 108 (90 BASE) MCG/ACT inhaler Inhale 1 puff into the lungs every 4 (four) hours as needed for wheezing or shortness of breath. Reported on 01/16/2016    . SYMBICORT 160-4.5 MCG/ACT inhaler Inhale 2 puffs into the lungs as needed (shortness of breath). Reported on 11/23/2015    . valsartan-hydrochlorothiazide (DIOVAN-HCT) 320-25 MG per tablet Take 1 tablet by mouth daily. Take 1 tab daily    . acetaminophen (TYLENOL) 500 MG tablet Take 1,000 mg by mouth every 6 (six) hours as needed. Reported on 01/16/2016    . aspirin 81 MG tablet Take 81 mg by mouth daily.    Marland Kitchen docusate sodium (COLACE) 100 MG capsule Take 100 mg by mouth 2 (two) times daily.     No current facility-administered medications for this encounter.     Physical Findings: The patient is in no acute distress. Patient is alert and oriented. Wt Readings from Last 3 Encounters:  06/13/16 160 lb 12.8 oz (72.9 kg)  04/27/16 159 lb 6.4 oz (72.3 kg)  02/22/16  160 lb 3.2 oz (72.7 kg)    height is 5\' 9"  (1.753 m) and weight is 160 lb 12.8 oz (72.9 kg). His temperature is 97.9 F (36.6 C). His blood pressure is 135/75 and his pulse is 85. His oxygen saturation is 100%. .  General: Alert and oriented, tearful. HEENT: He still has moist tissue in the right ear canal concerning for residual tumor consistent with the previous exam. Slight swelling in the region of the mastoid tip which could be alternatively be a slightly enlarged lymph node. Exquisite tenderness in the preauricular region. No obvious lymphadenopathy in the postauricular area or the posterior region of the neck. Skin : flushed appearance of face  Lab Findings: No results found for: WBC, HGB, HCT, MCV, PLT  No results found for: TSH  Radiographic Findings: No results found.  Impression/Plan:    1) Head and Neck Cancer Status: Persistent disease despite aggressive radiotherapy.  2.) Nutrition: Weight is stable, no nutritional issues.   3) Pain:  Constant right ear pain. The patient is taking Percocet and Aleve for this pain. I offered OxyContin, but the patient declined as he wanted to continue Percocet, Aleve, and Prednisone. If the patient's pain does not improve, then the patient could call and I could prescribe additional pain medication.  4) The patient states his old prescription of Prednisone has helped with his neck swelling and pain. I will provide a new prescription of Prednisone 10mg . The patient is taking 5 tablets for the first 3 days (15 tablets), 4 tablets the next 3 days (12 tablets), 3 tablets for the next 3 days (9 tablets), 2 tablets for 3 days (6), and 1 tablet for 3 days (3 tablets). He has already taken 5 tablets (of his old prescription) for the first day.  5) The patient is considering surgery for his persistent disease early next year, but has not made a final decision at this time.  6) The patient is scheduled to follow up on 07/11/16. The patient was  encouraged to call with any issues or questions before then. I will ask Gayleen Orem, RN, our Head and Neck Oncology Navigator to reach out to him.  7) We could consider palliative RT per the QUAD shot regimen if he declines surgery. Discussed this today  I spent 25 minutes face to face with the patient, over 50% on counseling and coordination of care. _____________________________________   Eppie Gibson, MD  This document serves as a record of services personally performed by Eppie Gibson, MD. It was created on her behalf by Darcus Austin, a trained medical scribe. The creation of this record is based on the scribe's personal observations and the provider's statements to them. This document has been checked and approved by the attending provider.

## 2016-06-13 NOTE — Progress Notes (Signed)
Oncology Nurse Navigator Documentation  Met with Mr. Mahan during f/u appt with Dr. Isidore Moos.  He was accompanied by his niece Linus Orn. He reported reduced R face swelling and discomfort since starting prednisone yesterday afternoon.  Dr. Isidore Moos issued ne Rx as tablets he is taking have expired. He continues to experience excruciating pain "burning" at HS.  Finds some relief when he irrigates his ear with worm water, applies warm compresses. He refused oxycontin Rx offered by Dr. Isidore Moos stating "I want to keep trying the prednisone until after the holidays". He understands I will contact him next week to check on his well-being, that he will see Dr. Isidore Moos in 3 weeks for reassessment.  Gayleen Orem, RN, BSN, [place] at [place] [phone]

## 2016-06-28 ENCOUNTER — Telehealth: Payer: Self-pay | Admitting: *Deleted

## 2016-06-28 NOTE — Telephone Encounter (Signed)
Oncology Nurse Navigator Documentation  Received call from Mr. Sean Richardson:  He requested prednisone refill.  He reported notable reduction in R ear soreness and inflammation while at the 50 mg/day dosage but became increasingly less comfortable as he began/completed taper.  He noted understanding of side effects of prolonged high dosage but feels symptom relief is worth risk.  He indicated because of increasing pain/discomfort, he is thinking of increasing his noontime Percocet to 2 tablets from 1 tab.  I noted Rx is for 1-2 tablets q 4 hrs, supported his decision. Dr. Isidore Moos informed.  Sean Orem, RN, BSN, [place] Neck Oncology Nurse [place] at [place] [phone]

## 2016-06-29 ENCOUNTER — Telehealth: Payer: Self-pay | Admitting: *Deleted

## 2016-06-29 ENCOUNTER — Other Ambulatory Visit: Payer: Self-pay | Admitting: Radiation Oncology

## 2016-06-29 DIAGNOSIS — C44222 Squamous cell carcinoma of skin of right ear and external auricular canal: Secondary | ICD-10-CM

## 2016-06-29 MED ORDER — PREDNISONE 10 MG PO TABS: ORAL_TABLET | ORAL | 0 refills | Status: DC

## 2016-06-29 MED FILL — predniSONE 10 MG TABS: 10 | 33 days supply | Qty: 100 | Fill #0

## 2016-06-29 NOTE — Telephone Encounter (Signed)
Oncology Nurse Navigator Documentation  Called Mr. Lovena Le, informed him Rx for 10 mg prednisone is available for pick-up at [facility].  Dr. Pearlie Oyster guidance is to take 30 mg daily with meal for pain, swelling.  He voiced understanding, indicated he will ask his niece to pick up Rx.  He expressed appreciation for assistance.  Gayleen Orem, RN, BSN, [place] Neck Oncology Nurse [place] at [place] [phone]

## 2016-07-02 ENCOUNTER — Other Ambulatory Visit: Payer: Self-pay | Admitting: Radiation Oncology

## 2016-07-02 ENCOUNTER — Telehealth: Payer: Self-pay | Admitting: *Deleted

## 2016-07-02 DIAGNOSIS — C44222 Squamous cell carcinoma of skin of right ear and external auricular canal: Secondary | ICD-10-CM

## 2016-07-02 MED ORDER — OXYCODONE-ACETAMINOPHEN 10-325 MG PO TABS: ORAL_TABLET | ORAL | 0 refills | Status: DC

## 2016-07-02 NOTE — Telephone Encounter (Signed)
Oncology Nurse Navigator Documentation  Spoke with Sean Richardson, informed him Percocet Rx available in [place] for his pick up.  He indicated he would come by tomorrow morning.  Gayleen Orem, RN, BSN, [place] Neck Oncology Nurse [place] at [place] [phone]

## 2016-07-03 MED FILL — OXYCODONE-APAP 10-325: 10-325 | 15 days supply | Qty: 180 | Fill #0

## 2016-07-09 NOTE — Progress Notes (Signed)
Mr. Sean Richardson presents for follow up of radiation completed 01/25/2016 to his Right ear canal/ pre-auricular.   Pain issues, if any: Yes he reports continued pain to his right ear. He rates it a 8/10. He is in tears today. He tells me that this is one of the worst days he has had in a while. He does report that the swelling to his right ear has decreased. He is currently taking 500 mg of Tylenol at 3 am and 5 am. He also takes an aleve at 4 am. He is taking 1 percocet at 12 noon, and 2 percocets at 4pm, 8pm, and at midnight. He feels better later in the day after taking 2 percocet at 4:00pm. He has only taken tylenol this morning, because he wanted to drive himself. He reports the cool, rainy weather today has exaserbated his symptoms.  Using a feeding tube?: No Weight changes, if any:  Wt Readings from Last 3 Encounters:  07/11/16 161 lb 12.8 oz (73.4 kg)  06/13/16 160 lb 12.8 oz (72.9 kg)  04/27/16 159 lb 6.4 oz (72.3 kg)   Swallowing issues, if any: He denies Smoking or chewing tobacco? No Using fluoride trays daily? No Last ENT visit was on: He reports he will no longer see an ENT. He reports he does not want to have surgery at this time.  Other notable issues, if any:  He reports a fluid filled cyst in his Right Ear that will drain periodically and burn. He has a cotton ball in place to his Right Ear at this time.   BP (!) 150/87   Pulse 90   Temp 97.8 F (36.6 C)   Ht 5\' 9"  (1.753 m)   Wt 161 lb 12.8 oz (73.4 kg)   SpO2 99% Comment: room air  BMI 23.89 kg/m

## 2016-07-11 ENCOUNTER — Encounter: Payer: Self-pay | Admitting: Radiation Oncology

## 2016-07-11 ENCOUNTER — Ambulatory Visit
Admission: RE | Admit: 2016-07-11 | Discharge: 2016-07-11 | Disposition: A | Discharge status: Home / Self Care | Payer: Medicare Other | Source: Ambulatory Visit | Attending: Radiation Oncology | Admitting: Radiation Oncology

## 2016-07-11 ENCOUNTER — Encounter: Payer: Self-pay | Admitting: *Deleted

## 2016-07-11 DIAGNOSIS — C44222 Squamous cell carcinoma of skin of right ear and external auricular canal: Secondary | ICD-10-CM | POA: Insufficient documentation

## 2016-07-11 DIAGNOSIS — Z7982 Long term (current) use of aspirin: Secondary | ICD-10-CM | POA: Insufficient documentation

## 2016-07-11 DIAGNOSIS — Z5189 Encounter for other specified aftercare: Secondary | ICD-10-CM | POA: Diagnosis present

## 2016-07-11 DIAGNOSIS — Z79899 Other long term (current) drug therapy: Secondary | ICD-10-CM | POA: Diagnosis not present

## 2016-07-11 DIAGNOSIS — Z923 Personal history of irradiation: Secondary | ICD-10-CM | POA: Insufficient documentation

## 2016-07-11 MED ORDER — OXYCODONE-ACETAMINOPHEN 10-325 MG PO TABS: ORAL_TABLET | ORAL | 0 refills | Status: DC

## 2016-07-11 NOTE — Progress Notes (Signed)
Oncology Nurse Navigator Documentation  To provide support, encouragement and care continuity, met with Sean Richardson during f/u appt with Dr. Isidore Moos.  He was accompanied by his niece Linus Orn. He reported:  Severe ear pain with onset of today's cold/wet weather.  "Always happens."  He has not taken analgesia yet this morning bc he wasn't sure if he would be driving himself to this appt.  Daily 30 mg prednisone is minimizing swelling which in turn is helping to reduce pain.  He agreed to increase 12:00 noon Percocet to 2 tablets from current single tablet in order to improve pain control.  Presently does not want to pursue surgery after holidays per his previous thoughts, would like to consider RT option previously suggested by Dr. Isidore Moos.  Had a "good Thanksgiving", able to enjoy the food and his family.  He is preparing Christmas Day dinner for his family. He understands I can be contacted with needs/concerns.  Gayleen Orem, RN, BSN, [place] Neck Oncology Nurse [place] at [place] [phone]

## 2016-07-11 NOTE — Progress Notes (Signed)
Radiation Oncology         [phone]) [phone] ________________________________  Name: Sean Richardson MRN: [id number]  Date: 07/11/2016  DOB: [birth year]  Follow-Up Visit Note  CC: Henrine Screws, MD  Rozetta Nunnery, *   Diagnosis and Prior Radiotherapy:   ICD-9-CM ICD-10-CM   1. 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  Chief Complaint:  Follow up of radiation completed to the Right ear canal/ pre-auricular 01/25/16.  Narrative:  The patient returns today for follow-up.  He reports continued pain to his right ear, which he rates 8/10 in severity. He is in tears today, and reports that this is one of the worst days he has had in a while. This is partly due to holding pain meds this AM. He does report that the swelling to his right ear has decreased. He is currently taking 500 mg of Tylenol at 3 am and 5 am; he also takes Aleve at 4 am. Additionally, the patient is taking 1 Percocet at 12 noon and 2 Percocet at 4 pm, 8 pm, and at midnight. He reports feeling better later in the day after taking 2 Percocet at 4 pm. He has only taken Tylenol this morning because he wanted to drive himself to the clinic. The patient also reports the cool, rainy weather today has exacerbated his symptoms.  He continues prednisone 30mg  daily with good effect and is certain that a lower dose will lead to relapse of swelling and severe pain, as occurred last time he tapered.  The patient's niece and Gayleen Orem, RN, our Head and Neck Oncology Navigator were present during the encounter.   ALLERGIES:  has No Known  Allergies.  Meds: Current Outpatient Prescriptions  Medication Sig Dispense Refill  . apixaban (ELIQUIS) 5 MG TABS tablet Take 1 tablet (5 mg total) by mouth 2 (two) times daily. 60 tablet 11  . fexofenadine (ALLEGRA) 180 MG tablet Take 180 mg by mouth daily.    Marland Kitchen oxyCODONE-acetaminophen (PERCOCET) 10-325 MG tablet Take 2 tablets q 4 hrs prn pain. 180 tablet 0  . polyethylene glycol (MIRALAX / GLYCOLAX) packet Take 17 g by mouth daily.    . predniSONE (DELTASONE) 10 MG tablet Take 3 tablets daily for pain, swelling. Take with a meal. 100 tablet 0  . PROAIR HFA 108 (90 BASE) MCG/ACT inhaler Inhale 1 puff into the lungs every 4 (four) hours as needed for wheezing or shortness of breath. Reported on 01/16/2016    . SYMBICORT 160-4.5 MCG/ACT inhaler Inhale 2 puffs into the lungs as needed (shortness of breath). Reported on 11/23/2015    . valsartan-hydrochlorothiazide (DIOVAN-HCT) 320-25 MG per tablet Take 1 tablet by mouth daily. Take 1 tab daily    . acetaminophen (TYLENOL) 500 MG tablet Take 1,000 mg by mouth every 6 (six) hours as needed. Reported on 01/16/2016    . aspirin 81 MG tablet Take 81 mg by mouth daily.    . ciprofloxacin (CIPRO) 500 MG tablet Take 500 mg by mouth 2 (two) times daily. He started 06/08/16    . docusate sodium (COLACE)  100 MG capsule Take 100 mg by mouth 2 (two) times daily.     No current facility-administered medications for this encounter.     Physical Findings:    height is 5\' 9"  (1.753 m) and weight is 161 lb 12.8 oz (73.4 kg). His temperature is 97.8 F (36.6 C). His blood pressure is 150/87 (abnormal) and his pulse is 90. His oxygen saturation is 99%.  General: Alert and oriented, in mild acute distress HEENT: White moist tumor eminating from the right ear canal, and the ear canal does appear to be with significant swelling and narrowed. No oral thrush. Neck: He has decreased erythema and decreased swelling along the right upper face and neck, as well as  decreased tenderness compared with previous exam. Some mild selling along the right jawbone. Still some tenderness in this area. No right supraclavicular nodes. No other obvious masses along the right neck.  Lymphatics: see Neck Exam Skin: see Head and Neck exam. Neurologic: No obvious focalities. Speech is fluent. Coordination is intact. Psychiatric: Judgment and insight are intact. Affect is appropriate.  Lab Findings: No results found for: WBC, HGB, HCT, MCV, PLT  No results found for: TSH  Radiographic Findings: No results found.  Impression/Plan:    1) Head and Neck Cancer Status: Persistent disease despite aggressive radiotherapy.  2.) Nutrition: Weight is stable, no nutritional issues.  3) Pain: Constant right ear pain. The patient is taking Percocet, Tylenol, and Aleve for this pain. I would like the patient to try long acting Oxycontin, which he would take once every 12 hours. He could still use his other medications for breakthrough pain. The patient is not interested in changing his medications right now. He is insistent that he wants to be able to continue to drive and is not interested in medications that will impair his ability to drive. I will refill the patient's Percocet with instructions to take 2 tablets every 4 hours.  4) The patient states his old prescription of Prednisone has helped with his neck swelling and pain. He will continue taking at this time. We discussed possible side effects of this  5) The patient is considering surgery for his persistent disease early next year, but has not made a final decision at this time.  6) The patient is a candidate for IMRT palliative re irradiation treatments (4 fractions); if the patient has a good response we could consider repeating the cycle 2 more times with 3 week breaks in between cycles. He was offered the option for radiation, but the patient would like to consider his options after the upcoming holidays. This would  not be prudent if he wants to consider surgical resection.  6) I will order a CT of the temporal bones and neck with contrast for reevaluation of his disease extrent the third week of January so that the patient can be discussed at the subsequent tumor board on 08/08/16. The patient will be scheduled to follow up with me on 08/08/16 with tentative CT Simulation that day. The patient was encouraged to call with any issues or questions before then.  7) I will refer the patient to home visits by palliative care to reach out to the patient after the start of the new year. The patient has expressed strong desire to enjoy his holidays without having to make medical decisions or plans. He needs help with transportation, often due to effects of medications.  It is a burden for him to make outpatient visits.  I spent 30  minutes face to face with the patient, over 50% on counseling and coordination of care. _____________________________________   Eppie Gibson, MD  This document serves as a record of services personally performed by Eppie Gibson, MD. It was created on her behalf by Maryla Morrow, a trained medical scribe. The creation of this record is based on the scribe's personal observations and the Xaviera Flaten's statements to them. This document has been checked and approved by the attending Shebra Muldrow.

## 2016-07-19 MED FILL — OXYCODONE-APAP 10-325: 10-325 | 15 days supply | Qty: 180 | Fill #0

## 2016-07-30 ENCOUNTER — Telehealth: Payer: Self-pay | Admitting: *Deleted

## 2016-07-30 ENCOUNTER — Other Ambulatory Visit: Payer: Self-pay | Admitting: Radiation Oncology

## 2016-07-30 DIAGNOSIS — C44222 Squamous cell carcinoma of skin of right ear and external auricular canal: Secondary | ICD-10-CM

## 2016-07-30 MED ORDER — PREDNISONE 10 MG PO TABS: ORAL_TABLET | ORAL | 0 refills | Status: DC

## 2016-07-30 MED FILL — predniSONE 10 MG TABS: 10 | 33 days supply | Qty: 100 | Fill #0

## 2016-07-30 NOTE — Telephone Encounter (Signed)
Oncology Nurse Navigator Documentation  Called Mr. Sean Richardson to inform Rx for prednisone has been called to [address], should be ready for pick-up.  He voiced appreciation.  Gayleen Orem, RN, BSN, [place] Neck Oncology Nurse [place] at [place] [phone]

## 2016-07-31 ENCOUNTER — Ambulatory Visit
Admission: RE | Admit: 2016-07-31 | Discharge: 2016-07-31 | Disposition: A | Discharge status: Home / Self Care | Payer: Medicare Other | Source: Ambulatory Visit | Attending: Radiation Oncology | Admitting: Radiation Oncology

## 2016-07-31 DIAGNOSIS — C44222 Squamous cell carcinoma of skin of right ear and external auricular canal: Secondary | ICD-10-CM

## 2016-07-31 MED ORDER — IOPAMIDOL (ISOVUE-300) INJECTION 61%
75.0000 mL | Freq: Once | INTRAVENOUS | Status: AC | PRN
  Administered 2016-07-31: 75 mL via INTRAVENOUS

## 2016-08-06 ENCOUNTER — Ambulatory Visit: Payer: Medicare Other | Admitting: Radiation Oncology

## 2016-08-07 ENCOUNTER — Telehealth: Payer: Self-pay | Admitting: *Deleted

## 2016-08-07 NOTE — Telephone Encounter (Signed)
Oncology Nurse Navigator Documentation  In follow-up to Mr. Colclough's request to reschedule tomorrow's f/u appt with Dr. Isidore Moos and [facility], rescheduled for 1/23 2:15 to see Dr. Isidore Moos, 3:00 CT SIM, called him to notify of change.  He voiced understanding, appreciation.  Gayleen Orem, RN, BSN, [place] Neck Oncology Nurse [place] at [place] [facility] [phone]

## 2016-08-08 ENCOUNTER — Ambulatory Visit: Admission: RE | Admit: 2016-08-08 | Payer: Medicare Other | Source: Ambulatory Visit | Admitting: Radiation Oncology

## 2016-08-08 ENCOUNTER — Ambulatory Visit: Payer: Medicare Other | Admitting: Radiation Oncology

## 2016-08-09 NOTE — Progress Notes (Addendum)
Sean Richardson returns for follow up of radiation completed 01/25/16 to his Right Ear canal/ preauricular area. He is also scheduled for simulation today.  Pain issues, if any: He continues to have pain to his Right Ear which he rates a 6/10. He is taking percocet (2) every 4 hours. He needs a refill today.  Using a feeding tube?: No Weight changes, if any:  Wt Readings from Last 3 Encounters:  08/14/16 161 lb 3.2 oz (73.1 kg)  07/11/16 161 lb 12.8 oz (73.4 kg)  06/13/16 160 lb 12.8 oz (72.9 kg)   Swallowing issues, if any: No Smoking or chewing tobacco? No Using fluoride trays daily? N/A Last ENT visit was on: N/A Other notable issues, if any:  07/31/16 CT Temporal Bones IMPRESSION: 1. Progressive opacification of the right external ear with small region of new osseous erosion involving the anterior wall of the EAC concerning for progressive tumor. 2. Slight increase of small volume soft tissue in the right epitympanum. 3. New 1.3 cm nodule along the posterior right parotid gland most consistent with a metastatic lymph node.  07/31/16 Ct Soft Tissue Neck IMPRESSION: 1. Progressive opacification of the right external ear with small region of new osseous erosion involving the anterior wall of the EAC concerning for progressive tumor. 2. Slight increase of small volume soft tissue in the right epitympanum. 3. New 1.3 cm nodule along the posterior right parotid gland most consistent with a metastatic lymph node.  BP (!) 146/71   Pulse 86   Temp 98.1 F (36.7 C)   Ht 5\' 9"  (1.753 m)   Wt 161 lb 3.2 oz (73.1 kg)   SpO2 99% Comment: room air  BMI 23.81 kg/m

## 2016-08-13 ENCOUNTER — Encounter: Payer: Self-pay | Admitting: Radiation Oncology

## 2016-08-14 ENCOUNTER — Ambulatory Visit
Admission: RE | Admit: 2016-08-14 | Discharge: 2016-08-14 | Disposition: A | Discharge status: Home / Self Care | Payer: Medicare Other | Source: Ambulatory Visit | Attending: Radiation Oncology | Admitting: Radiation Oncology

## 2016-08-14 DIAGNOSIS — C44222 Squamous cell carcinoma of skin of right ear and external auricular canal: Secondary | ICD-10-CM

## 2016-08-14 DIAGNOSIS — Z923 Personal history of irradiation: Secondary | ICD-10-CM | POA: Diagnosis not present

## 2016-08-14 DIAGNOSIS — Z510 Encounter for antineoplastic radiation therapy: Secondary | ICD-10-CM | POA: Diagnosis present

## 2016-08-14 DIAGNOSIS — Z5189 Encounter for other specified aftercare: Secondary | ICD-10-CM | POA: Insufficient documentation

## 2016-08-14 HISTORY — DX: Personal history of irradiation: Z92.3

## 2016-08-14 MED ORDER — OXYCODONE-ACETAMINOPHEN 10-325 MG PO TABS: ORAL_TABLET | ORAL | 0 refills | Status: DC

## 2016-08-14 NOTE — Progress Notes (Signed)
Radiation Oncology         [phone]) [phone] ________________________________  Name: Sean Richardson MRN: [id number]  Date: 08/14/2016  DOB: 1933/03/12  Follow-Up Visit Note  CC: Henrine Screws, MD  Rozetta Nunnery, *   Diagnosis and Prior Radiotherapy:   ICD-9-CM ICD-10-CM   1. 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  Chief Complaint:  Follow up of radiation completed to the Right ear canal  Narrative:  The patient returns today for follow-up, and to review his most recent CT of the temporal bones and neck from 07/31/16. The scan showed progressive opacification of the right external ear with a small region of new osseous erosion involving the anterior wall of the EAC concerning for progressive tumor. A slight increase of small volume soft tissue in the right epitympanum is noted. Additionally, a new 1.3 cm nodule along the posterior right parotid gland most consistent with a metastatic lymph node is noted. I reviewed the images and discussed results with Dr Jeralyn Ruths today.  On review of systems, the patient reports continued pain to his Right Ear, 6/10 in severity. He is currently taking 2 Percocet every 4 hours, and reports he needs a refill today. The patient denies any swallowing issues. He is not smoking or chewing tobacco.  The patient's niece is present during the encounter. Prednisone and oxycodone are helping his swelling/pain at current doses better than previous doses in December.  ALLERGIES:  has No Known Allergies.  Meds: Current Outpatient Prescriptions  Medication Sig Dispense Refill  . acetaminophen (TYLENOL) 500 MG tablet Take 1,000 mg by mouth every 6 (six) hours as needed. Reported on  01/16/2016    . apixaban (ELIQUIS) 5 MG TABS tablet Take 1 tablet (5 mg total) by mouth 2 (two) times daily. 60 tablet 11  . aspirin 81 MG tablet Take 81 mg by mouth daily.    Marland Kitchen docusate sodium (COLACE) 100 MG capsule Take 100 mg by mouth 2 (two) times daily.    . fexofenadine (ALLEGRA) 180 MG tablet Take 180 mg by mouth daily.    Marland Kitchen oxyCODONE-acetaminophen (PERCOCET) 10-325 MG tablet Take 2 tablets q 4 hrs prn pain. 180 tablet 0  . polyethylene glycol (MIRALAX / GLYCOLAX) packet Take 17 g by mouth daily.    . predniSONE (DELTASONE) 10 MG tablet Take 3 tablets daily for pain, swelling. Take with a meal. 100 tablet 0  . PROAIR HFA 108 (90 BASE) MCG/ACT inhaler Inhale 1 puff into the lungs every 4 (four) hours as needed for wheezing or shortness of breath. Reported on 01/16/2016    . SYMBICORT 160-4.5 MCG/ACT inhaler Inhale 2 puffs into the lungs as needed (shortness of breath). Reported on 11/23/2015    . valsartan-hydrochlorothiazide (DIOVAN-HCT) 320-25 MG per tablet Take 1 tablet by mouth daily. Take 1 tab daily     No current facility-administered medications for this encounter.     Physical Findings:    height is _0  (1.753 m) and weight is 161 lb 3.2 oz (73.1 kg). His temperature is 98.1 F (36.7 C). His blood pressure is 146/71 (abnormal) and his pulse is 86. His oxygen saturation is 99%.  General: Alert and oriented, in mild acute distress. HEENT: Right external auditory meatus shows erythema and deeper into  the ear canal there is a white moist tumor that seems to fill most of the canal. This looks similar to his previous exam. Some swelling in the face which may be due to the steroids.  Neck: Some swelling posterior to the angle of the mandible, just inferior to the right external ear. This may be a lymph node. Otherwise no obvious masses in the neck are appreciated. Lymphatics: see Neck Exam Skin: see Head and Neck exam. Neurologic: No obvious focalities. Speech is fluent.   Psychiatric:  Judgment and insight are intact. Affect is appropriate.  Lab Findings: No results found for: WBC, HGB, HCT, MCV, PLT  No results found for: TSH  Radiographic Findings: Ct Soft Tissue Neck W Contrast  Result Date: 07/31/2016 CLINICAL DATA:  Right external auditory canal squamous cell carcinoma status post radiation therapy completed 01/2016. EXAM: CT NECK WITH CONTRAST CT TEMPORAL BONES WITH CONTRAST TECHNIQUE: Multidetector CT imaging of the neck was performed using the standard protocol following the bolus administration of intravenous contrast. Axial and coronal plane CT imaging of the petrous temporal bones was performed with thin-collimation image reconstruction after intravenous contrast administration. Multiplanar CT image reconstructions were also generated. CONTRAST:  58m ISOVUE-300 IOPAMIDOL (ISOVUE-300) INJECTION 61% COMPARISON:  04/16/2016 FINDINGS: CT NECK: Pharynx and larynx: Mild motion artifact. No evidence of pharyngeal or laryngeal mass. Salivary glands: There is a new 1.3 cm rounded mass along the posterior margin of the right parotid gland, likely an abnormal lymph node and possibly necrotic (series 3, image 39). Left parotid and bilateral submandibular glands are unremarkable. Thyroid: Unremarkable within limitations of motion artifact. Lymph nodes: The sub 5 mm short axis right level IIB lymph nodes described on the prior study have decreased in size/resolved. No new or suspicious lymph nodes are identified elsewhere in the neck. Vascular: Major vascular structures of the neck appear patent. Bilateral carotid artery atherosclerosis is again seen with right greater than left proximal ICA narrowing. Limited intracranial: Partially visualized cerebral and cerebellar atrophy. Visualized orbits: Unremarkable. Mastoids and visualized paranasal sinuses: Paranasal sinuses are clear. Temporal bones are more fully evaluated below. Skeleton: Subcentimeter sclerotic foci in the C7 vertebral body  are unchanged. Mild cervical spondylosis and moderate right C4-5 facet arthrosis are noted. Upper chest: Similar appearance of right apical lung consolidation, pleural thickening, calcification, and bronchiectasis. Mild pleuroparenchymal scarring and emphysema in the left lung apex. Other: None. CT TEMPORAL BONES: RIGHT: There is progressive, complete soft tissue opacification of the external auditory canal. Previously demonstrated calcification within the EAC soft tissue mass is largely no longer present. There is new erosion of the anterior osseous wall of the EAC (series 6, image 85). Small volume soft tissue in Prussak's space has mildly increased, with progressive extension superiorly in the epitympanum though without associated osseous erosion. The middle ear is otherwise clear. The ossicles appear intact. The internal auditory canal, cochlea, vestibule, and semicircular canals are unremarkable. Mastoid air cell opacification has decreased without air cell coalescence. LEFT: The external auditory canal and tympanic membrane are unremarkable. The ossicles appear intact. The tympanic cavity is clear. The internal auditory canal, cochlea, vestibule, and semicircular canals are unremarkable. The mastoid air cells are clear. IMPRESSION: 1. Progressive opacification of the right external ear with small region of new osseous erosion involving the anterior wall of the EAC concerning for progressive tumor. 2. Slight increase of small volume soft tissue in the right epitympanum. 3. New 1.3 cm nodule along the posterior right parotid gland most consistent with a metastatic  lymph node. Electronically Signed   By: Logan Bores M.D.   On: 07/31/2016 10:50   Ct Temporal Bones W Contrast  Result Date: 07/31/2016 CLINICAL DATA:  Right external auditory canal squamous cell carcinoma status post radiation therapy completed 01/2016. EXAM: CT NECK WITH CONTRAST CT TEMPORAL BONES WITH CONTRAST TECHNIQUE: Multidetector CT imaging  of the neck was performed using the standard protocol following the bolus administration of intravenous contrast. Axial and coronal plane CT imaging of the petrous temporal bones was performed with thin-collimation image reconstruction after intravenous contrast administration. Multiplanar CT image reconstructions were also generated. CONTRAST:  34m ISOVUE-300 IOPAMIDOL (ISOVUE-300) INJECTION 61% COMPARISON:  04/16/2016 FINDINGS: CT NECK: Pharynx and larynx: Mild motion artifact. No evidence of pharyngeal or laryngeal mass. Salivary glands: There is a new 1.3 cm rounded mass along the posterior margin of the right parotid gland, likely an abnormal lymph node and possibly necrotic (series 3, image 39). Left parotid and bilateral submandibular glands are unremarkable. Thyroid: Unremarkable within limitations of motion artifact. Lymph nodes: The sub 5 mm short axis right level IIB lymph nodes described on the prior study have decreased in size/resolved. No new or suspicious lymph nodes are identified elsewhere in the neck. Vascular: Major vascular structures of the neck appear patent. Bilateral carotid artery atherosclerosis is again seen with right greater than left proximal ICA narrowing. Limited intracranial: Partially visualized cerebral and cerebellar atrophy. Visualized orbits: Unremarkable. Mastoids and visualized paranasal sinuses: Paranasal sinuses are clear. Temporal bones are more fully evaluated below. Skeleton: Subcentimeter sclerotic foci in the C7 vertebral body are unchanged. Mild cervical spondylosis and moderate right C4-5 facet arthrosis are noted. Upper chest: Similar appearance of right apical lung consolidation, pleural thickening, calcification, and bronchiectasis. Mild pleuroparenchymal scarring and emphysema in the left lung apex. Other: None. CT TEMPORAL BONES: RIGHT: There is progressive, complete soft tissue opacification of the external auditory canal. Previously demonstrated calcification  within the EAC soft tissue mass is largely no longer present. There is new erosion of the anterior osseous wall of the EAC (series 6, image 85). Small volume soft tissue in Prussak's space has mildly increased, with progressive extension superiorly in the epitympanum though without associated osseous erosion. The middle ear is otherwise clear. The ossicles appear intact. The internal auditory canal, cochlea, vestibule, and semicircular canals are unremarkable. Mastoid air cell opacification has decreased without air cell coalescence. LEFT: The external auditory canal and tympanic membrane are unremarkable. The ossicles appear intact. The tympanic cavity is clear. The internal auditory canal, cochlea, vestibule, and semicircular canals are unremarkable. The mastoid air cells are clear. IMPRESSION: 1. Progressive opacification of the right external ear with small region of new osseous erosion involving the anterior wall of the EAC concerning for progressive tumor. 2. Slight increase of small volume soft tissue in the right epitympanum. 3. New 1.3 cm nodule along the posterior right parotid gland most consistent with a metastatic lymph node. Electronically Signed   By: ALogan BoresM.D.   On: 07/31/2016 10:50    Impression/Plan:    1) Head and Neck Cancer Status: Persistent/progressive disease despite aggressive radiotherapy.  2.) Nutrition: Weight is stable, no nutritional issues.  3) Pain: Constant right ear pain. The patient is taking Percocet for this pain. I will refill the patient's Percocet with instructions to take 2 tablets every 4 hours.  4) The patient states his Prednisone prescription has continued to help with his neck swelling and pain. He will continue taking at this time. We discussed possible  side effects of this.  5) The patient is a candidate for palliative re-treatment with IMRT via the QUADSHOT method.  We can simulate today, and then give BID tx two days in a row in about 1.5 weeks  once plan is ready. If the patient has a good response we could consider repeating the cycle 1-2 more times with 3 week breaks in between cycles. He was offered the option for radiation,and is in agreement with moving forward. He is scheduled for CT Simulation this afternoon. We discussed that moving ahead with radiation therapy would most likely rule out the possibility for radical surgical resection (and possible cure) in the future. The patient is adamant that he does not want surgery. He has met with Dr Lucia Gaskins to discuss this option. A consent form was discussed, signed, and placed in the patient's chart acknowledging increased risks with reirradiation.  Niece is supportive. Goal is palliative, decreasing swelling and pain, and improving QOL.  I spent 30 minutes face to face with the patient, over 50% on counseling and coordination of care. _____________________________________   Eppie Gibson, MD  This document serves as a record of services personally performed by Eppie Gibson, MD. It was created on her behalf by Maryla Morrow, a trained medical scribe. The creation of this record is based on the scribe's personal observations and the provider's statements to them. This document has been checked and approved by the attending provider.

## 2016-08-14 NOTE — Progress Notes (Signed)
Head and Neck Cancer Simulation, IMRT treatment planning note, Special Treatment Procedure Note  Outpatient  Diagnosis:    ICD-9-CM ICD-10-CM   1. 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 An Aquaplast head and shoulder mask was custom fitted to the patient's anatomy. High-resolution CT axial imaging was obtained of the head and neck without contrast. I verified that the quality of the imaging is good for treatment planning. 1 Medically Necessary Treatment Device  were fabricated and supervised by me: Aquaplast mask    Treatment planning note I plan to treat the patient with IMRT. I plan to treat the patient's right ear canal tumor + enlarged parotid node on the right - I plan to treat to a total dose of 14.8 Gray in 4  Fractions (BID, two days in a row).  This cycle may be repeated 1-2 times with 3 week breaks, depending on how well it palliates him.  Dose calculation was ordered from dosimetry.  IMRT planning Note  IMRT is an important modality to deliver adequate dose to the patient's at risk tissues while sparing the patient's normal structures, including the:  parotid tissue, mandible, brain stem, brain,  oral cavity,  This justifies the use of IMRT in the patient's treatment.   Special Treatment Procedure Note: The patient received prior radiotherapy in his current fields. There will be some overlap of radiation dose.  Prior regional radiotherapy increases the risk of side effects from treatment. I have considered this in the treatment planning process.  This increases the complexity of this patient's treatment and therefore this constitutes a special treatment procedure.    -----------------------------------  Eppie Gibson, MD

## 2016-08-15 MED FILL — OXYCODONE-APAP 10-325: 10-325 | 15 days supply | Qty: 180 | Fill #0

## 2016-08-17 ENCOUNTER — Telehealth: Payer: Self-pay | Admitting: Radiation Oncology

## 2016-08-17 NOTE — Telephone Encounter (Signed)
I spoke with Mr Hulton over the phone, as he had contacted Gayleen Orem, RN, our Head and Neck Oncology Navigator about swelling and pain.  The swelling is worse in the AM around his right ear. Pain stable.  We discussed the risks, benefits, and side effects of increasing the prednisone.  I advised him to take an extra 10mg  in the AM at his discretion if the swelling persists. He is pleased with this plan.  We discussed moving up the start date of RT but he would like to keep it as is.     -----------------------------------  Eppie Gibson, MD

## 2016-08-22 DIAGNOSIS — Z510 Encounter for antineoplastic radiation therapy: Secondary | ICD-10-CM | POA: Diagnosis not present

## 2016-08-27 ENCOUNTER — Ambulatory Visit
Admission: RE | Admit: 2016-08-27 | Discharge: 2016-08-27 | Disposition: A | Discharge status: Home / Self Care | Payer: Medicare Other | Source: Ambulatory Visit | Attending: Radiation Oncology | Admitting: Radiation Oncology

## 2016-08-27 ENCOUNTER — Encounter: Payer: Self-pay | Admitting: Radiation Oncology

## 2016-08-27 DIAGNOSIS — Z510 Encounter for antineoplastic radiation therapy: Secondary | ICD-10-CM | POA: Diagnosis not present

## 2016-08-27 DIAGNOSIS — C44222 Squamous cell carcinoma of skin of right ear and external auricular canal: Secondary | ICD-10-CM

## 2016-08-27 MED ORDER — PREDNISONE 10 MG PO TABS: ORAL_TABLET | ORAL | 0 refills | Status: AC

## 2016-08-27 MED ORDER — OXYCODONE-ACETAMINOPHEN 10-325 MG PO TABS: ORAL_TABLET | ORAL | 0 refills | Status: DC

## 2016-08-27 MED FILL — predniSONE 10 MG TABS: 10 | 47 days supply | Qty: 100 | Fill #0

## 2016-08-27 NOTE — Progress Notes (Signed)
   Weekly Management Note:  Outpatient    ICD-9-CM ICD-10-CM   1. 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  CBCT/MVCT images/Port film x-rays were reviewed.  The chart was checked. Noted more swelling in the bilateral ankles.    Physical Findings:  Wt Readings from Last 3 Encounters:  08/27/16 169 lb (76.7 kg)  08/14/16 161 lb 3.2 oz (73.1 kg)  07/11/16 161 lb 12.8 oz (73.4 kg)    height is 5\' 9"  (1.753 m) and weight is 169 lb (76.7 kg). His temperature is 98.2 F (36.8 C). His blood pressure is 169/80 (abnormal) and his pulse is 84. His oxygen saturation is 95%.  no thrush. Pitting edema in ankles. No calf tenderness. Heart irregularly irregular in rhythm, regular rate (consistent with baseline a Fib). Lungs CTAB  CBC No results found for: WBC, RBC, HGB, HCT, PLT, MCV, MCH, MCHC, RDW, LYMPHSABS, MONOABS, EOSABS, BASOSABS   CMP  No results found for: NA, K, CL, CO2, GLUCOSE, BUN, CREATININE, CALCIUM, PROT, ALBUMIN, AST, ALT, ALKPHOS, BILITOT, GFRNONAA, GFRAA   Impression:  The patient is tolerating radiotherapy.   Plan:  Continue radiotherapy as planned. Refilled Oxycodone and Prednisone. He will taper Prednisone in 6 days.  I suspect that is causing his ankle edema but he knows to schedule an appt with his PCP or cardiologist to make sure his heart health is stable. He will keep LE's elevated.  -----------------------------------  Eppie Gibson, MD

## 2016-08-27 NOTE — Progress Notes (Signed)
Mr. Sean Richardson is here for his first fraction of radiation to his Right Ear. He reports continued pain to his Right Ear. He is taking 2 oxycodone every 4 hours. He is taking prednisone at this time also. He had decided to not increase his prednisone dose after discussing with Dr. Isidore Moos over the phone. He does have questions about how his prednisone should be dosed. He does report increased swelling in his bilateral lower extremities, and is concerned that the prednisone may be causing this.  He reports he is eating well.   BP (!) 169/80   Pulse 84   Temp 98.2 F (36.8 C)   Ht 5\' 9"  (1.753 m)   Wt 169 lb (76.7 kg)   SpO2 95% Comment: room air  BMI 24.96 kg/m    Wt Readings from Last 3 Encounters:  08/27/16 169 lb (76.7 kg)  08/14/16 161 lb 3.2 oz (73.1 kg)  07/11/16 161 lb 12.8 oz (73.4 kg)

## 2016-08-28 ENCOUNTER — Ambulatory Visit
Admission: RE | Admit: 2016-08-28 | Discharge: 2016-08-28 | Disposition: A | Discharge status: Home / Self Care | Payer: Medicare Other | Source: Ambulatory Visit | Attending: Radiation Oncology | Admitting: Radiation Oncology

## 2016-08-28 DIAGNOSIS — Z510 Encounter for antineoplastic radiation therapy: Secondary | ICD-10-CM | POA: Diagnosis not present

## 2016-08-30 MED FILL — OXYCODONE-ACETAMINOPHEN 10-: 10-325 | 19 days supply | Qty: 228 | Fill #0

## 2016-09-12 NOTE — Progress Notes (Signed)
Mr. Wataru presents for follow up. He is currently receiving palliative radiation to his Right Ear.    Pain issues, if any: He continues to have pain to his Right Ear. He is taking 2 percocet every 4 hours for pain, and began taking gabapentin 300mg  three times daily. He has just taken his pain medicine and currently rates his pain a 5/10. Using a feeding tube?: No Weight changes, if any:  Wt Readings from Last 3 Encounters:  09/17/16 165 lb 9.6 oz (75.1 kg)  08/27/16 169 lb (76.7 kg)  08/14/16 161 lb 3.2 oz (73.1 kg)   Swallowing issues, if any: He reports to be eating well. His taste has changed, and he has some difficulty eating certain foods.  Smoking or chewing tobacco? No Using fluoride trays daily?N/A Other notable issues, if any:  He has neck stiffness, and is unable to straighten his neck.  He fell against a piece of furniture because of instability while walking.   BP 130/73   Pulse 97   Temp 98.1 F (36.7 C)   Ht 5\' 9"  (1.753 m)   Wt 165 lb 9.6 oz (75.1 kg)   SpO2 98% Comment: room air  BMI 24.45 kg/m

## 2016-09-14 ENCOUNTER — Telehealth: Payer: Self-pay

## 2016-09-14 ENCOUNTER — Telehealth: Payer: Self-pay | Admitting: *Deleted

## 2016-09-14 ENCOUNTER — Other Ambulatory Visit: Payer: Self-pay | Admitting: Radiation Oncology

## 2016-09-14 DIAGNOSIS — C44222 Squamous cell carcinoma of skin of right ear and external auricular canal: Secondary | ICD-10-CM

## 2016-09-14 MED ORDER — GABAPENTIN 300 MG PO CAPS: 300.0000 mg | ORAL_CAPSULE | Freq: Three times a day (TID) | ORAL | 3 refills | Status: AC

## 2016-09-14 MED FILL — GABAPENTIN 300 MG CAPSULE: 300 | 30 days supply | Qty: 90 | Fill #0

## 2016-09-14 NOTE — Telephone Encounter (Signed)
Oncology Nurse Navigator Documentation  Returned call to Mr. Keckler's niece, Linus Orn. She indicated she will be out of town next Monday so his neighbor Thamas Jaegers will be bringing him for his appt with Dr. Isidore Moos and tmts. She wanted Dr. Isidore Moos to be aware of notable changes that have occurred with Ernie Hew since he completed initial round of RT 2/6:  He is taking maximum dosage of Percocet daily as prescribed for increased pain (2 tabs q 4 hrs).  He began today generic Flexeril Rxed by [place] who has been making weekly visits.  Swelling in feet and face has noticeably increased.  Weakness has increased, ambulation around house has lessened.  To her knowledge, he has not left his home since 2/6 EOT, no longer leaves home in morning to run errands which has until recently been his routine. I assured her I would forward this information to Dr. Isidore Moos in preparation for his appt.  Note also routed to [employer].  Gayleen Orem, RN, BSN, [facility] Neck Oncology Nurse [place] [facility] [place] [phone]

## 2016-09-14 NOTE — Telephone Encounter (Signed)
I called Sean Richardson to let him know that a new prescription was available at the [facility] for his pick up to help with his pain. He voiced to me that he is having severe pain to his neck. He cannot raise his neck up at this time. It was apparent in the sound of his voice that he was having severe pain. I will forward this note to Dr. Isidore Moos. He appreciated the phone call and will begin taking the pain medicine as directed when he is able to pick it up today. He knows to call with any other questions or concerns.

## 2016-09-17 ENCOUNTER — Telehealth: Payer: Self-pay | Admitting: *Deleted

## 2016-09-17 ENCOUNTER — Ambulatory Visit: Payer: Medicare Other

## 2016-09-17 ENCOUNTER — Ambulatory Visit
Admission: RE | Admit: 2016-09-17 | Discharge: 2016-09-17 | Disposition: A | Discharge status: Home / Self Care | Payer: Medicare Other | Source: Ambulatory Visit | Attending: Radiation Oncology | Admitting: Radiation Oncology

## 2016-09-17 ENCOUNTER — Encounter: Payer: Self-pay | Admitting: *Deleted

## 2016-09-17 ENCOUNTER — Ambulatory Visit: Admission: RE | Admit: 2016-09-17 | Payer: Medicare Other | Source: Ambulatory Visit

## 2016-09-17 ENCOUNTER — Encounter: Payer: Self-pay | Admitting: Radiation Oncology

## 2016-09-17 DIAGNOSIS — Z923 Personal history of irradiation: Secondary | ICD-10-CM | POA: Diagnosis not present

## 2016-09-17 DIAGNOSIS — Z79899 Other long term (current) drug therapy: Secondary | ICD-10-CM | POA: Insufficient documentation

## 2016-09-17 DIAGNOSIS — C44222 Squamous cell carcinoma of skin of right ear and external auricular canal: Secondary | ICD-10-CM

## 2016-09-17 DIAGNOSIS — Z08 Encounter for follow-up examination after completed treatment for malignant neoplasm: Secondary | ICD-10-CM | POA: Insufficient documentation

## 2016-09-17 DIAGNOSIS — Z7982 Long term (current) use of aspirin: Secondary | ICD-10-CM | POA: Diagnosis not present

## 2016-09-17 DIAGNOSIS — H9201 Otalgia, right ear: Secondary | ICD-10-CM | POA: Insufficient documentation

## 2016-09-17 MED ORDER — OXYCODONE-ACETAMINOPHEN 10-325 MG PO TABS: ORAL_TABLET | ORAL | 0 refills | Status: AC

## 2016-09-17 NOTE — Progress Notes (Signed)
Radiation Oncology         [phone]) [phone] ________________________________  Name: Sean Richardson MRN: [id number]  Date: 09/17/2016  DOB: 1932/11/29  Follow-Up Visit Note  CC: Henrine Screws, MD  Rozetta Nunnery, *   Diagnosis and Prior Radiotherapy:   ICD-9-CM ICD-10-CM   1. 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  He then completed one cycle of the QUAD SHOT regimen 3 weeks ago for palliative intent  Chief Complaint:  Follow up of radiation completed to the Right ear canal  Narrative:  The patient returns today for follow-up   Pain issues, if any: He continues to have pain to his Right Ear. He is taking 2 percocet every 4 hours for pain, and began taking gabapentin 300mg  three times daily. He has just taken his pain medicine and currently rates his pain a 5/10.  His niece called to inform us she thinks he is doing worse overall.  He continues to live alone with close friends and family checking on him.    He still has pleasure from his computer work.    Weight changes, if any:     Wt Readings from Last 3 Encounters:  09/17/16 165 lb 9.6 oz (75.1 kg)  08/27/16 169 lb (76.7 kg)  08/14/16 161 lb 3.2 oz (73.1 kg)    Swallowing issues, if any: He reports to be eating well. His taste has changed, and he has some difficulty eating certain foods.    He has neck stiffness, and is unable to straighten his neck.   He fell against a piece of furniture because of instability while walking.  He attributes this to craning his neck forward and not seeing things. Recently started neurontin for pain.  Has visits from palliative medicine.  Still adamant against curative radical surgery.    ALLERGIES:  has No  Known Allergies.  Meds: Current Outpatient Prescriptions  Medication Sig Dispense Refill  . acetaminophen (TYLENOL) 500 MG tablet Take 1,000 mg by mouth every 6 (six) hours as needed. Reported on 01/16/2016    . apixaban (ELIQUIS) 5 MG TABS tablet Take 1 tablet (5 mg total) by mouth 2 (two) times daily. 60 tablet 11  . aspirin 81 MG tablet Take 81 mg by mouth daily.    Marland Kitchen docusate sodium (COLACE) 100 MG capsule Take 100 mg by mouth 2 (two) times daily.    . fexofenadine (ALLEGRA) 180 MG tablet Take 180 mg by mouth daily.    Marland Kitchen gabapentin (NEURONTIN) 300 MG capsule Take 1 capsule (300 mg total) by mouth 3 (three) times daily. Take with caution. May increase risk of falls, may be sedating. 90 capsule 3  . oxyCODONE-acetaminophen (PERCOCET) 10-325 MG tablet Take 2 tablets q 4 hrs prn pain. Future Rx's from hospice. 180 tablet 0  . polyethylene glycol (MIRALAX / GLYCOLAX) packet Take 17 g by mouth daily.    . predniSONE (DELTASONE) 10 MG tablet Take 3 tablets daily for pain, swelling. Take with a meal. Taper to 2 tablets daily on 09-02-16. 100 tablet 0  . PROAIR HFA 108 (90 BASE) MCG/ACT inhaler Inhale 1 puff into the lungs every 4 (four) hours as needed for wheezing or shortness of breath. Reported on 01/16/2016    . SYMBICORT 160-4.5 MCG/ACT inhaler  Inhale 2 puffs into the lungs as needed (shortness of breath). Reported on 11/23/2015    . valsartan-hydrochlorothiazide (DIOVAN-HCT) 320-25 MG per tablet Take 1 tablet by mouth daily. Take 1 tab daily     No current facility-administered medications for this encounter.     Physical Findings:    height is 5\' 9"  (1.753 m) and weight is 165 lb 9.6 oz (75.1 kg). His temperature is 98.1 F (36.7 C). His blood pressure is 130/73 and his pulse is 97. His oxygen saturation is 98%.  General: Alert and oriented, in mild acute distress. HEENT: Right external auditory meatus- persistent slightly progressive  white moist tumor fills the canal.  Some swelling in the  face which may be due to the steroids.  Neck:  no obvious masses in the neck are appreciated. Lymphatics: see Neck Exam Skin: see Head and Neck exam. Neurologic: No obvious focalities. Speech is fluent.   Psychiatric: Judgment and insight are intact. Affect is appropriate. MSK: persistent neck flexion, hunched over EXT: lower extremity edema  Lab Findings: No results found for: WBC, HGB, HCT, MCV, PLT  No results found for: TSH  Radiographic Findings: No results found.  Impression/Plan:    1) Head and Neck Cancer Status: Persistent/progressive disease despite aggressive radiotherapy and QUAD shot palliative treatment. No symptom improvement from reirradiation.    2.) Nutrition: Weight is stable, no nutritional issues.  3) Pain: Constant right ear pain. The patient is taking Percocet for this pain. I will refill the patient's Percocet with instructions to take 2 tablets every 4 hours. Continue neurontin.  4) The patient states his Prednisone prescription has continued to help with his neck swelling and pain. He will continue taking at this time. We have discussed possible side effects of this. LE edema noted.  5) We had a lengthy talk about hospice.  I recommend this for QOL and symptom control.  His disease is radioresistant and I do not think another cycle of palliative RT will help him (sadly, the quad shot fractionation didn't provide any symptom improvement).  He is not a good candidate for further RT, or chemotherapy, and he has declined surgery.  He is enthusiastic to enroll on hospice.  Pt will inquire with hospice if home visits from PT are available for his neck.  I will make a referral and see him back in a month at his request.   I spent >25 minutes face to face with the patient, over 50% on counseling and coordination of care. _____________________________________   Eppie Gibson, MD

## 2016-09-17 NOTE — Progress Notes (Signed)
Oncology Nurse Navigator Documentation  Met with Sean Richardson during follow-up with Dr. Isidore Moos.  In absence of improvement with recent RT cycle, he voiced understanding additional RT not prudent.  He supported referral to hospice.  We discussed that he will seamlessly transition from in-home Palliative services to hospice care, that hospice MD will communicate with Dr. Isidore Moos as needed.  He wishes to be continue follow-up with Dr. Isidore Moos, she agreed.  He obtained appt to see her 3/30 10:00. He understands I can be contacted with needs/concerns.  Gayleen Orem, RN, BSN, [place] [facility] Oncology Nurse [place] at [place] [phone]

## 2016-09-17 NOTE — Telephone Encounter (Signed)
Oncology Nurse Navigator Documentation  Received call from Mr. Goyal's sister-in-law Brick Soderberg. She indicated she received call from Mr. Lovena Le following his appt with Dr. Isidore Moos. I confirmed information Mr. Lovena Le shared with re termination of tmt, hospice referral.  Gayleen Orem, RN, BSN, [place] at [place] [phone]

## 2016-09-18 ENCOUNTER — Ambulatory Visit: Payer: Medicare Other

## 2016-09-18 ENCOUNTER — Other Ambulatory Visit: Payer: Self-pay | Admitting: Radiation Oncology

## 2016-09-18 DIAGNOSIS — C44222 Squamous cell carcinoma of skin of right ear and external auricular canal: Secondary | ICD-10-CM

## 2016-09-19 MED FILL — OXYCODONE-ACETAMINOPHEN 10-: 10-325 | 15 days supply | Qty: 180 | Fill #0

## 2016-09-21 MED FILL — METHADONE HCL 5 MG TABLET: 5 | 15 days supply | Qty: 45 | Fill #0

## 2016-09-28 ENCOUNTER — Encounter: Payer: Self-pay | Admitting: Radiation Oncology

## 2016-09-28 NOTE — Progress Notes (Signed)
  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  When he returned for follow-up in 3 weeks to consider repeating the cycle of palliative RT, he had not shown improvement in his symptoms.  After a long discussion, he decided to enroll in hospice.  Plan: The patient has completed radiation treatment. The patient will return to radiation oncology clinic for routine followup in one month. I advised them to call or return sooner if they have any questions or concerns related to their recovery or treatment.  He was also referred to hospice for supportive care.  -----------------------------------  Eppie Gibson, MD  This document serves as a record of services personally performed by Eppie Gibson, MD. It was created on her behalf by Bethann Humble, a trained medical scribe. The creation of this record is based on the scribe's personal observations and the provider's statements to them. This document has been checked and approved by the attending provider.

## 2016-10-04 MED FILL — METHADONE HCL 5 MG TABLET: 5 | 15 days supply | Qty: 70 | Fill #0

## 2016-10-11 MED FILL — GABAPENTIN 300 MG CAPSULE: 300 | 30 days supply | Qty: 90 | Fill #1

## 2016-10-15 ENCOUNTER — Encounter: Payer: Self-pay | Admitting: Radiation Oncology

## 2016-10-15 NOTE — Progress Notes (Signed)
error 

## 2016-10-19 ENCOUNTER — Ambulatory Visit
Admission: RE | Admit: 2016-10-19 | Discharge: 2016-10-19 | Disposition: A | Discharge status: Home / Self Care | Payer: Medicare Other | Source: Ambulatory Visit | Attending: Radiation Oncology | Admitting: Radiation Oncology

## 2016-10-22 MED FILL — ONDANSETRON HCL 4 MG TABLET: 4 | 14 days supply | Qty: 42 | Fill #0

## 2016-10-22 MED FILL — predniSONE 10 MG TABS: 10 | 14 days supply | Qty: 28 | Fill #0

## 2016-10-22 MED FILL — METHADONE HCL 5 MG TABLET: 5 | 14 days supply | Qty: 67 | Fill #0

## 2016-10-23 MED FILL — HALOPERIDOL 2 MG TABLET: 2 | 5 days supply | Qty: 60 | Fill #0

## 2016-10-25 MED FILL — MORPHINE SULF 100 MG/5 ML S: 100 | 5 days supply | Qty: 30 | Fill #0

## 2016-10-25 MED FILL — fentaNYL 100 MCG/HR PT72: 100 | 15 days supply | Qty: 5 | Fill #0

## 2016-10-29 ENCOUNTER — Telehealth: Payer: Self-pay | Admitting: *Deleted

## 2016-10-29 NOTE — Telephone Encounter (Signed)
Oncology Nurse Navigator Documentation  Received call from Mr. Steelman's niece, Daine Floras, notifying Yossi died at home last 20-Nov-2022.   Staff notified.  Gayleen Orem, RN, BSN, [place] [facility] Oncology Nurse [place] at [place] [phone]

## 2016-11-20 DEATH — deceased

## 2017-10-22 IMAGING — CT CT NECK W/ CM
3 series · 15 of 27 positions shown, 18 images · IV contrast (iopamidol)
Comparison: Temporal bone CT 10/21/2015.  04/05/2004 chest CT.

Temporal bone CT performed same date dictated separately.

CLINICAL DATA: 83-year-old male with stage I external auditory
canal squamous cell carcinoma treated with radiation therapy.
Subsequent encounter.

Creatinine was obtained on site at [HOSPITAL] at [HOSPITAL].Results: Creatinine 0.9 mg/dL.
EXAM:
CT NECK WITH CONTRAST
TECHNIQUE: Multidetector CT imaging of the neck was performed using the
standard protocol following the bolus administration of intravenous
contrast.
CONTRAST:  80mL PNYD4C-NLL IOPAMIDOL (PNYD4C-NLL) INJECTION 61%

[Series 2: neck · axial · 0.47mm/px · z∈[+1051,+1225]mm · 8 of 113 slices shown, 10 images]
[im 13/113  soft-tissue]
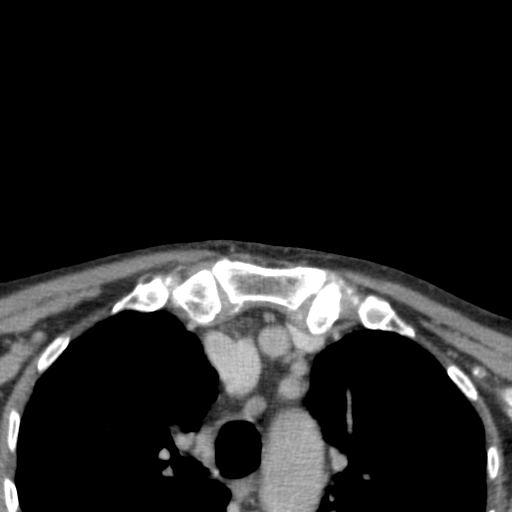
[im 13/113  bone]
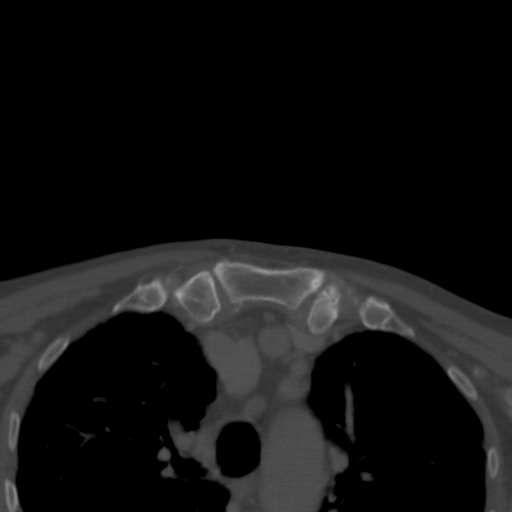
[im 25/113  bone]
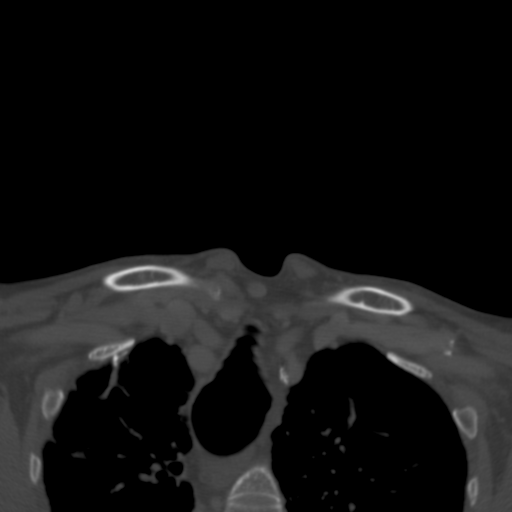
[im 38/113  bone]
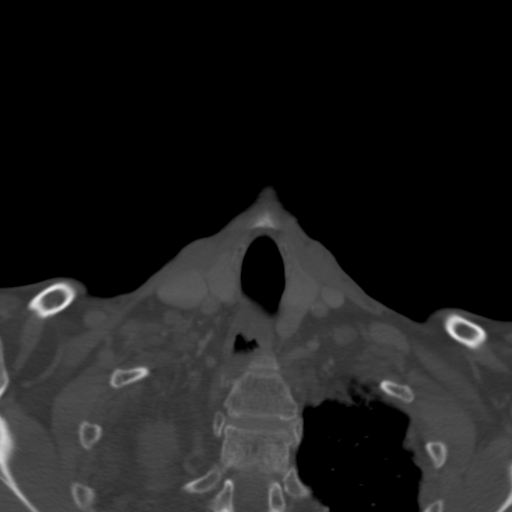
[im 50/113  bone]
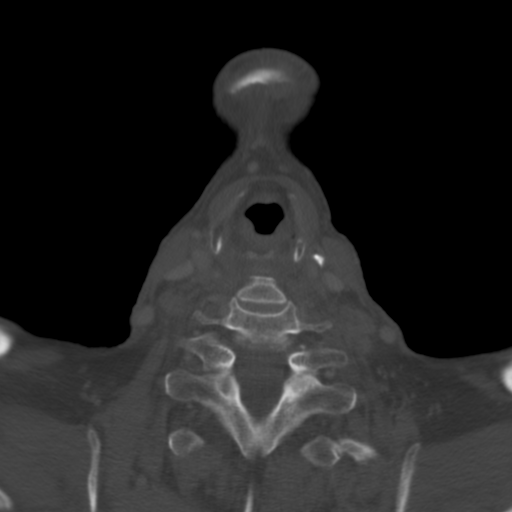
[im 63/113  soft-tissue]
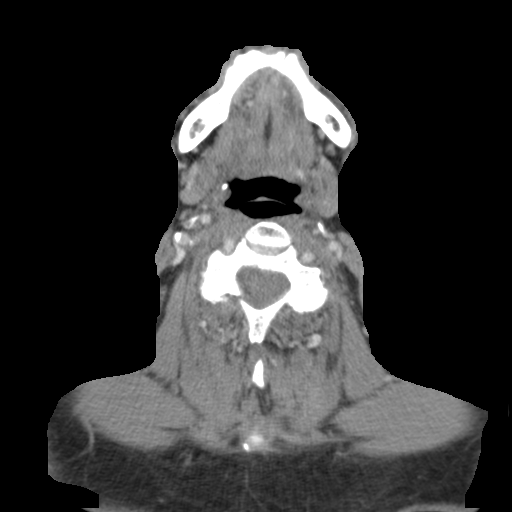
[im 63/113  bone]
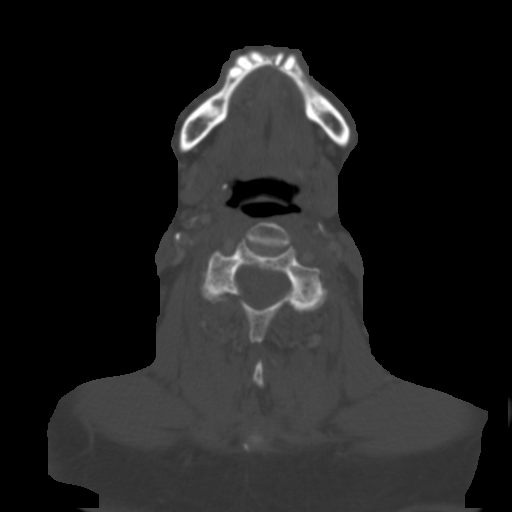
[im 75/113  bone]
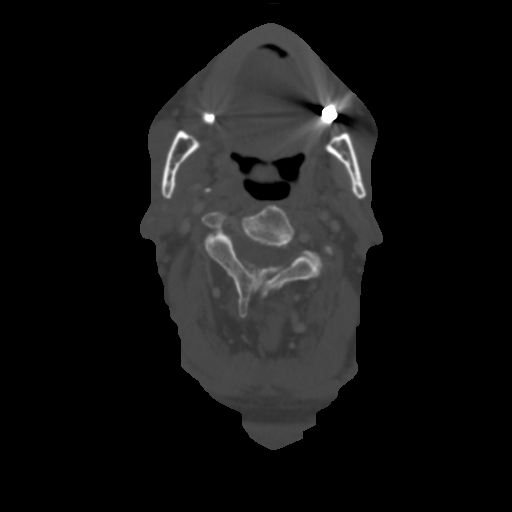
[im 88/113  bone]
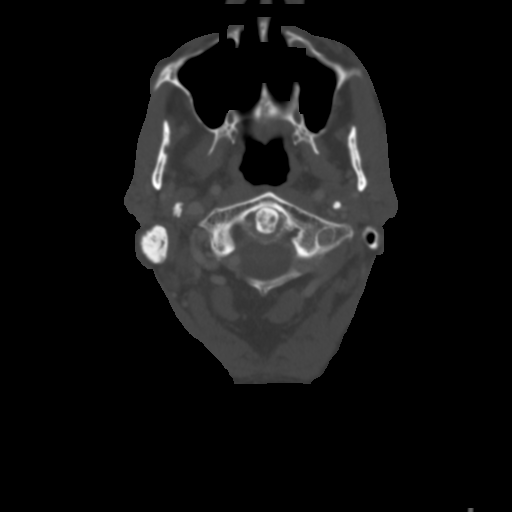
[im 100/113  bone]
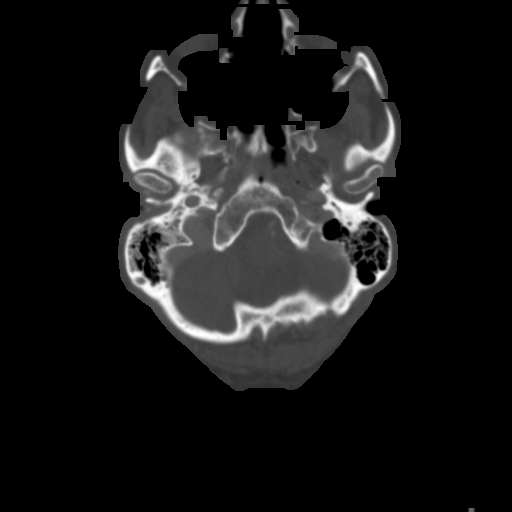

[Series 3: lungs · axial · 0.49mm/px · z∈[+1060,+1092]mm · 2 of 50 slices shown]
[im 17/50  bone]
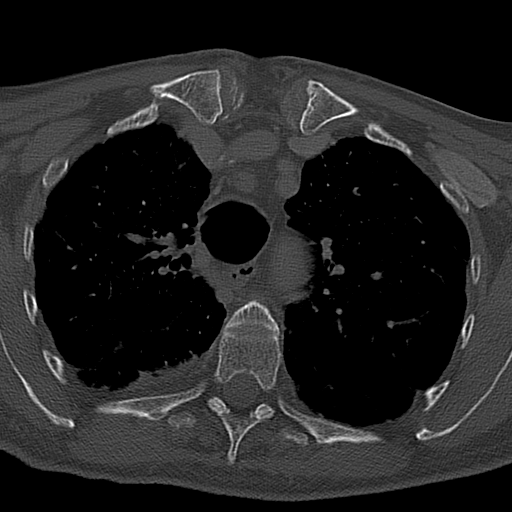
[im 33/50  bone]
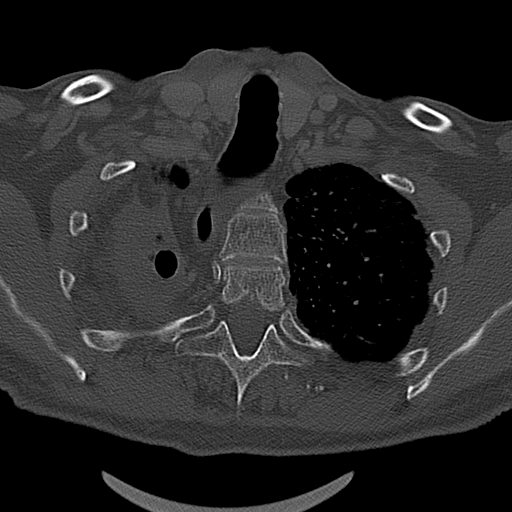

[Series 5: sag · sagittal · 0.44mm/px · 5 of 119 slices shown, 6 images]
[im 40/119  bone]
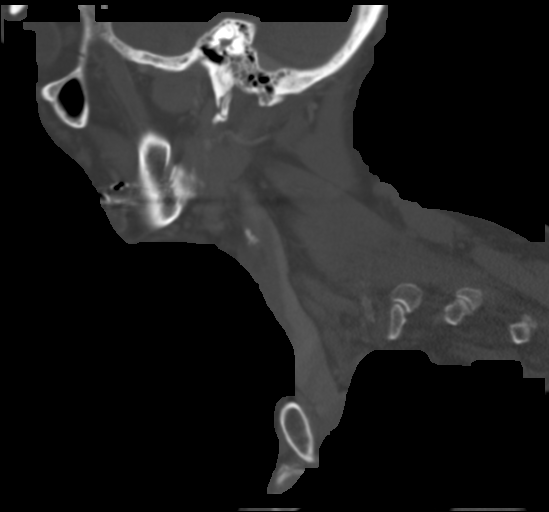
[im 50/119  bone]
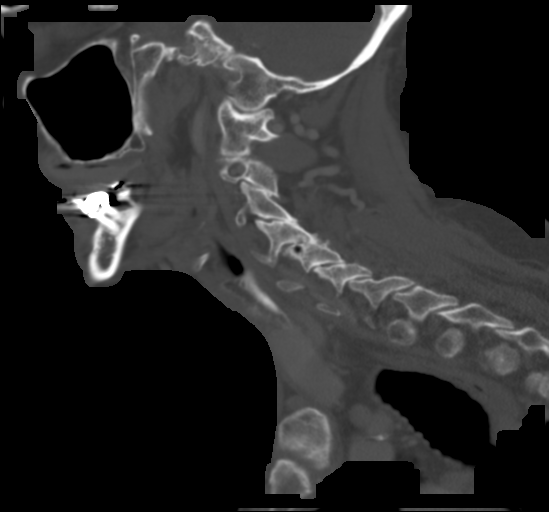
[im 60/119  soft-tissue]
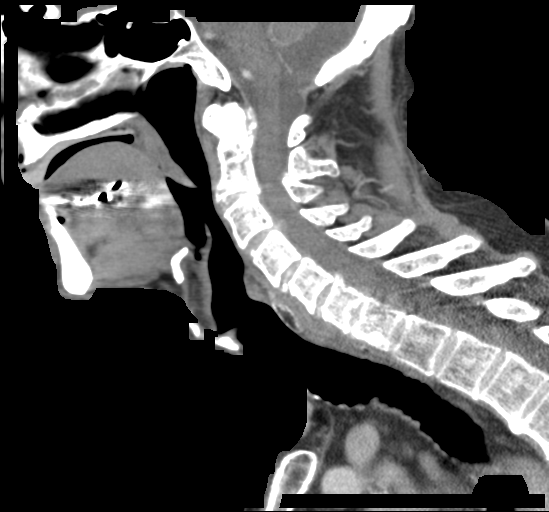
[im 60/119  bone]
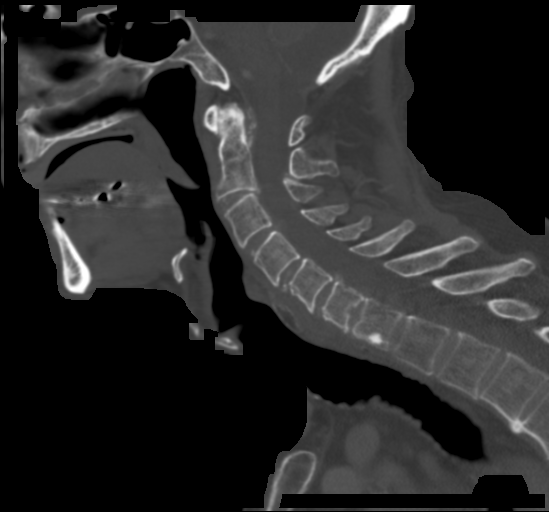
[im 69/119  bone]
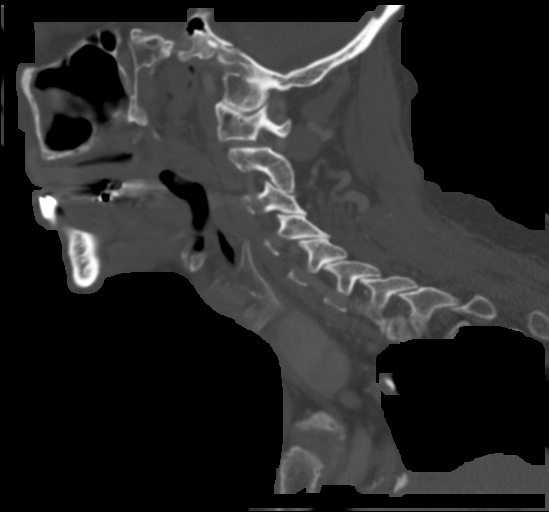
[im 79/119  bone]
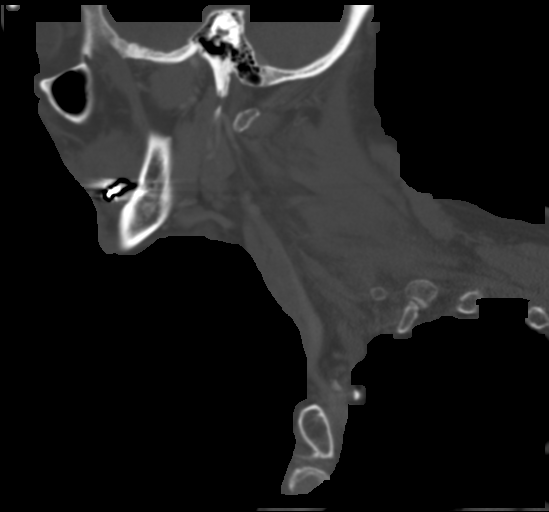

[15 of 27 positions shown; findings below may reference images not displayed]

FINDINGS: Pharynx and larynx: No primary pharynx or laryngeal mass.

Salivary glands: No primary parotid or submandibular gland mass.

Thyroid: No primary thyroid mass.

Lymph nodes: Small right posterior level 2 lymph nodes (series 2,
image 38 and series 5, image [DATE] represent small fatty
containing lymph nodes versus tiny necrotic lymph nodes. No other
findings of adenopathy.

Vascular: Bilateral carotid bifurcation calcifications with
narrowing greater on the right (64 percent diameter stenosis right
carotid bifurcation/ proximal right internal carotid artery).

Limited intracranial: No parenchymal enhancing lesion of visualized
brain.

Visualized orbits: No orbital abnormality noted.

Mastoids and visualized paranasal sinuses: Please see petrous
temporal bone CT report performed same date and dictated separately.

Skeleton: Mild transverse ligament hypertrophy. Mild spondylotic
changes included C3-4 right paracentral protrusion and slight cord
flattening.

Upper chest: Right upper lobe/apical consolidation has progressed
since 5225. This contains bullae/ blebs and calcifications in
addition to pleural thickening. The fact that there were changes in
the right lung apex on remote examinations in addition to
calcification suggests that changes may be related to a chronic
inflammatory/infection process rather than tumor. Less notable left
apical pleural thickening. No associated bony destruction.

Other: Negative.
IMPRESSION: Please see petrous temporal bone CT performed same date and dictated
separately.

Small right posterior level 2 lymph nodes may represent small fatty
containing lymph nodes versus tiny necrotic lymph nodes.

Bilateral carotid bifurcation calcifications with narrowing greater
on the right (64 % diameter stenosis right carotid bifurcation/
proximal right internal carotid artery).

Progressive chronic biapical parenchymal changes/ pleural thickening
greater on the right may be related to a chronic
inflammatory/infectious process rather than tumor (as some of these
changes were noted on 5225 exam).
